# Patient Record
Sex: Female | Born: 1935 | Race: White | Hispanic: No | Marital: Married
Health system: Southern US, Community
[De-identification: ages and names within clinical notes are randomized; demographics above are authoritative.]

---

## 2003-09-19 ENCOUNTER — Encounter: Payer: Self-pay | Admitting: Neurosurgery

## 2003-09-19 ENCOUNTER — Ambulatory Visit (HOSPITAL_COMMUNITY): Admission: RE | Admit: 2003-09-19 | Discharge: 2003-09-19 | Payer: Self-pay | Admitting: Neurosurgery

## 2004-09-30 ENCOUNTER — Ambulatory Visit: Payer: Self-pay | Admitting: Pain Medicine

## 2004-11-11 ENCOUNTER — Ambulatory Visit: Payer: Self-pay | Admitting: Physician Assistant

## 2004-12-02 ENCOUNTER — Ambulatory Visit: Payer: Self-pay | Admitting: Internal Medicine

## 2005-01-20 ENCOUNTER — Ambulatory Visit: Payer: Self-pay | Admitting: Pain Medicine

## 2005-04-17 ENCOUNTER — Ambulatory Visit: Payer: Self-pay | Admitting: Physician Assistant

## 2005-04-29 ENCOUNTER — Other Ambulatory Visit: Payer: Self-pay

## 2005-05-06 ENCOUNTER — Inpatient Hospital Stay: Payer: Self-pay | Admitting: Unknown Physician Specialty

## 2005-06-04 ENCOUNTER — Encounter: Payer: Self-pay | Admitting: Unknown Physician Specialty

## 2005-06-28 ENCOUNTER — Encounter: Payer: Self-pay | Admitting: Unknown Physician Specialty

## 2005-08-12 ENCOUNTER — Ambulatory Visit: Payer: Self-pay | Admitting: Physician Assistant

## 2005-09-29 ENCOUNTER — Ambulatory Visit: Payer: Self-pay | Admitting: Unknown Physician Specialty

## 2005-12-01 ENCOUNTER — Ambulatory Visit: Payer: Self-pay | Admitting: Physician Assistant

## 2005-12-15 ENCOUNTER — Ambulatory Visit: Payer: Self-pay | Admitting: Physician Assistant

## 2005-12-16 ENCOUNTER — Ambulatory Visit: Payer: Self-pay | Admitting: Pain Medicine

## 2006-01-06 ENCOUNTER — Ambulatory Visit: Payer: Self-pay | Admitting: Physician Assistant

## 2006-01-20 ENCOUNTER — Ambulatory Visit: Payer: Self-pay | Admitting: Internal Medicine

## 2006-02-12 ENCOUNTER — Ambulatory Visit (HOSPITAL_COMMUNITY): Admission: RE | Admit: 2006-02-12 | Discharge: 2006-02-12 | Payer: Self-pay | Admitting: Neurosurgery

## 2006-02-13 ENCOUNTER — Inpatient Hospital Stay (HOSPITAL_COMMUNITY): Admission: RE | Admit: 2006-02-13 | Discharge: 2006-02-14 | Payer: Self-pay | Admitting: Neurosurgery

## 2006-03-30 ENCOUNTER — Ambulatory Visit: Payer: Self-pay | Admitting: Physician Assistant

## 2006-04-10 ENCOUNTER — Encounter: Admission: RE | Admit: 2006-04-10 | Discharge: 2006-04-10 | Payer: Self-pay | Admitting: Neurosurgery

## 2006-07-02 ENCOUNTER — Ambulatory Visit: Payer: Self-pay | Admitting: Physician Assistant

## 2006-07-08 ENCOUNTER — Ambulatory Visit: Payer: Self-pay | Admitting: Unknown Physician Specialty

## 2006-07-31 ENCOUNTER — Ambulatory Visit: Payer: Self-pay | Admitting: Physician Assistant

## 2006-08-12 ENCOUNTER — Ambulatory Visit: Payer: Self-pay | Admitting: Neurosurgery

## 2006-10-15 ENCOUNTER — Encounter: Admission: RE | Admit: 2006-10-15 | Discharge: 2006-10-15 | Payer: Self-pay | Admitting: Neurosurgery

## 2006-10-27 ENCOUNTER — Ambulatory Visit: Payer: Self-pay | Admitting: Physician Assistant

## 2006-11-10 ENCOUNTER — Ambulatory Visit: Payer: Self-pay | Admitting: Ophthalmology

## 2006-11-16 ENCOUNTER — Ambulatory Visit: Payer: Self-pay | Admitting: Ophthalmology

## 2006-11-26 ENCOUNTER — Ambulatory Visit: Payer: Self-pay | Admitting: Physician Assistant

## 2007-02-04 ENCOUNTER — Ambulatory Visit: Payer: Self-pay | Admitting: Internal Medicine

## 2007-02-16 ENCOUNTER — Other Ambulatory Visit: Payer: Self-pay

## 2007-02-19 ENCOUNTER — Ambulatory Visit: Payer: Self-pay | Admitting: Physician Assistant

## 2007-02-22 ENCOUNTER — Inpatient Hospital Stay: Payer: Self-pay | Admitting: Unknown Physician Specialty

## 2007-03-22 ENCOUNTER — Ambulatory Visit: Payer: Self-pay | Admitting: Physician Assistant

## 2007-04-19 ENCOUNTER — Ambulatory Visit: Payer: Self-pay | Admitting: Physician Assistant

## 2007-04-26 ENCOUNTER — Ambulatory Visit: Payer: Self-pay | Admitting: Physician Assistant

## 2007-05-10 ENCOUNTER — Ambulatory Visit: Payer: Self-pay | Admitting: Physician Assistant

## 2007-05-12 ENCOUNTER — Ambulatory Visit: Payer: Self-pay | Admitting: Pain Medicine

## 2007-05-13 ENCOUNTER — Ambulatory Visit: Payer: Self-pay | Admitting: Pain Medicine

## 2007-05-26 ENCOUNTER — Ambulatory Visit: Payer: Self-pay | Admitting: Pain Medicine

## 2007-06-18 ENCOUNTER — Ambulatory Visit: Payer: Self-pay | Admitting: Physician Assistant

## 2007-06-29 ENCOUNTER — Ambulatory Visit: Payer: Self-pay | Admitting: Pain Medicine

## 2007-07-13 ENCOUNTER — Ambulatory Visit: Payer: Self-pay | Admitting: Physician Assistant

## 2007-08-02 ENCOUNTER — Ambulatory Visit: Payer: Self-pay | Admitting: Pain Medicine

## 2007-08-03 ENCOUNTER — Ambulatory Visit: Payer: Self-pay | Admitting: Pain Medicine

## 2007-08-10 ENCOUNTER — Ambulatory Visit: Payer: Self-pay | Admitting: Unknown Physician Specialty

## 2007-08-17 ENCOUNTER — Ambulatory Visit: Payer: Self-pay | Admitting: Pain Medicine

## 2007-09-01 ENCOUNTER — Ambulatory Visit: Payer: Self-pay | Admitting: Pain Medicine

## 2007-11-01 ENCOUNTER — Inpatient Hospital Stay: Payer: Self-pay | Admitting: Pain Medicine

## 2007-11-01 ENCOUNTER — Ambulatory Visit: Payer: Self-pay | Admitting: Pain Medicine

## 2007-11-23 ENCOUNTER — Ambulatory Visit: Payer: Self-pay | Admitting: Pain Medicine

## 2007-11-30 ENCOUNTER — Ambulatory Visit: Payer: Self-pay | Admitting: Pain Medicine

## 2007-12-09 ENCOUNTER — Ambulatory Visit: Payer: Self-pay | Admitting: Physician Assistant

## 2007-12-15 ENCOUNTER — Ambulatory Visit: Payer: Self-pay | Admitting: Physician Assistant

## 2008-01-20 ENCOUNTER — Ambulatory Visit: Payer: Self-pay | Admitting: Physician Assistant

## 2008-01-27 ENCOUNTER — Ambulatory Visit: Payer: Self-pay | Admitting: Physician Assistant

## 2008-02-07 ENCOUNTER — Ambulatory Visit: Payer: Self-pay | Admitting: Internal Medicine

## 2008-02-08 ENCOUNTER — Ambulatory Visit: Payer: Self-pay | Admitting: Pain Medicine

## 2008-02-08 ENCOUNTER — Ambulatory Visit: Payer: Self-pay | Admitting: Physician Assistant

## 2008-02-29 ENCOUNTER — Ambulatory Visit: Payer: Self-pay | Admitting: Physician Assistant

## 2008-03-08 ENCOUNTER — Ambulatory Visit: Payer: Self-pay | Admitting: Physician Assistant

## 2008-04-11 ENCOUNTER — Ambulatory Visit: Payer: Self-pay | Admitting: Physician Assistant

## 2008-04-25 ENCOUNTER — Ambulatory Visit: Payer: Self-pay | Admitting: Pain Medicine

## 2008-05-09 ENCOUNTER — Ambulatory Visit: Payer: Self-pay | Admitting: Physician Assistant

## 2008-06-28 ENCOUNTER — Ambulatory Visit: Payer: Self-pay | Admitting: Physician Assistant

## 2008-08-08 ENCOUNTER — Ambulatory Visit: Payer: Self-pay | Admitting: Physician Assistant

## 2008-08-22 ENCOUNTER — Ambulatory Visit: Payer: Self-pay | Admitting: Pain Medicine

## 2008-09-05 ENCOUNTER — Ambulatory Visit: Payer: Self-pay | Admitting: Physician Assistant

## 2008-09-27 ENCOUNTER — Ambulatory Visit: Payer: Self-pay | Admitting: Physician Assistant

## 2008-10-24 ENCOUNTER — Ambulatory Visit: Payer: Self-pay | Admitting: Physician Assistant

## 2009-02-08 ENCOUNTER — Ambulatory Visit: Payer: Self-pay | Admitting: Internal Medicine

## 2009-02-20 ENCOUNTER — Ambulatory Visit: Payer: Self-pay | Admitting: Physician Assistant

## 2009-03-07 ENCOUNTER — Ambulatory Visit: Payer: Self-pay | Admitting: Physician Assistant

## 2009-05-16 ENCOUNTER — Ambulatory Visit: Payer: Self-pay | Admitting: Physician Assistant

## 2009-08-08 ENCOUNTER — Ambulatory Visit: Payer: Self-pay | Admitting: Physician Assistant

## 2009-08-21 ENCOUNTER — Ambulatory Visit: Payer: Self-pay | Admitting: Pain Medicine

## 2009-09-05 ENCOUNTER — Ambulatory Visit: Payer: Self-pay | Admitting: Physician Assistant

## 2009-12-04 ENCOUNTER — Ambulatory Visit: Payer: Self-pay | Admitting: Physician Assistant

## 2010-02-11 ENCOUNTER — Ambulatory Visit: Payer: Self-pay | Admitting: Internal Medicine

## 2010-02-28 ENCOUNTER — Ambulatory Visit: Payer: Self-pay | Admitting: Pain Medicine

## 2010-04-02 ENCOUNTER — Ambulatory Visit: Payer: Self-pay | Admitting: Pain Medicine

## 2010-07-24 ENCOUNTER — Ambulatory Visit: Payer: Self-pay | Admitting: Neurosurgery

## 2010-07-29 ENCOUNTER — Ambulatory Visit: Payer: Self-pay | Admitting: Pain Medicine

## 2010-11-16 ENCOUNTER — Inpatient Hospital Stay: Payer: Self-pay | Admitting: Internal Medicine

## 2011-01-19 ENCOUNTER — Encounter: Payer: Self-pay | Admitting: Neurosurgery

## 2011-08-18 ENCOUNTER — Inpatient Hospital Stay: Payer: Self-pay | Admitting: Surgery

## 2012-02-09 ENCOUNTER — Ambulatory Visit: Payer: Self-pay | Admitting: Gastroenterology

## 2012-02-13 ENCOUNTER — Emergency Department: Payer: Self-pay | Admitting: Emergency Medicine

## 2012-02-14 LAB — COMPREHENSIVE METABOLIC PANEL
Alkaline Phosphatase: 79 U/L (ref 50–136)
Bilirubin,Total: 0.4 mg/dL (ref 0.2–1.0)
Calcium, Total: 9.2 mg/dL (ref 8.5–10.1)
Co2: 31 mmol/L (ref 21–32)
Creatinine: 0.71 mg/dL (ref 0.60–1.30)
Osmolality: 279 (ref 275–301)
SGOT(AST): 18 U/L (ref 15–37)
SGPT (ALT): 10 U/L — ABNORMAL LOW

## 2012-02-14 LAB — CBC
HCT: 32.3 % — ABNORMAL LOW (ref 35.0–47.0)
HGB: 10.3 g/dL — ABNORMAL LOW (ref 12.0–16.0)
MCHC: 32 g/dL (ref 32.0–36.0)

## 2012-02-14 LAB — TROPONIN I: Troponin-I: <0.02 ng/mL

## 2012-02-14 LAB — URINALYSIS, COMPLETE
Bacteria: NONE SEEN
Leukocyte Esterase: NEGATIVE
Ph: 6 (ref 4.5–8.0)
RBC,UR: 3 /HPF (ref 0–5)
Squamous Epithelial: NONE SEEN
WBC UR: 1 /HPF (ref 0–5)

## 2012-08-22 ENCOUNTER — Emergency Department: Payer: Self-pay | Admitting: Emergency Medicine

## 2013-02-28 ENCOUNTER — Inpatient Hospital Stay: Payer: Self-pay | Admitting: Internal Medicine

## 2013-02-28 LAB — URINALYSIS, COMPLETE
Bilirubin,UR: NEGATIVE
Glucose,UR: NEGATIVE mg/dL (ref 0–75)
Nitrite: NEGATIVE
RBC,UR: 10 /HPF (ref 0–5)

## 2013-02-28 LAB — COMPREHENSIVE METABOLIC PANEL
Anion Gap: 6 — ABNORMAL LOW (ref 7–16)
BUN: 22 mg/dL — ABNORMAL HIGH (ref 7–18)
Glucose: 85 mg/dL (ref 65–99)
Potassium: 3.9 mmol/L (ref 3.5–5.1)
SGOT(AST): 24 U/L (ref 15–37)
SGPT (ALT): 9 U/L — ABNORMAL LOW (ref 12–78)
Sodium: 137 mmol/L (ref 136–145)
Total Protein: 6.7 g/dL (ref 6.4–8.2)

## 2013-02-28 LAB — CBC
HGB: 10.4 g/dL — ABNORMAL LOW (ref 12.0–16.0)
MCHC: 31.6 g/dL — ABNORMAL LOW (ref 32.0–36.0)

## 2013-03-01 LAB — CBC WITH DIFFERENTIAL/PLATELET
Basophil %: 0.2 %
Eosinophil #: 0 10*3/uL (ref 0.0–0.7)
Eosinophil %: 0.3 %
HGB: 9.7 g/dL — ABNORMAL LOW (ref 12.0–16.0)
Lymphocyte #: 0.5 10*3/uL — ABNORMAL LOW (ref 1.0–3.6)
Lymphocyte %: 4.7 %
MCH: 27.2 pg (ref 26.0–34.0)
Monocyte #: 0.8 x10 3/mm (ref 0.2–0.9)
Monocyte %: 7.4 %
RBC: 3.58 10*6/uL — ABNORMAL LOW (ref 3.80–5.20)
RDW: 17.1 % — ABNORMAL HIGH (ref 11.5–14.5)

## 2013-03-01 LAB — BASIC METABOLIC PANEL
BUN: 18 mg/dL (ref 7–18)
Calcium, Total: 8.5 mg/dL (ref 8.5–10.1)
EGFR (Non-African Amer.): >60
Potassium: 3.8 mmol/L (ref 3.5–5.1)
Sodium: 140 mmol/L (ref 136–145)

## 2013-03-02 LAB — CBC WITH DIFFERENTIAL/PLATELET
Eosinophil %: 0.5 %
HGB: 10.2 g/dL — ABNORMAL LOW (ref 12.0–16.0)
Lymphocyte #: 0.7 10*3/uL — ABNORMAL LOW (ref 1.0–3.6)
MCH: 27.1 pg (ref 26.0–34.0)
MCHC: 32.1 g/dL (ref 32.0–36.0)
MCV: 84 fL (ref 80–100)
Neutrophil #: 10.5 10*3/uL — ABNORMAL HIGH (ref 1.4–6.5)
Platelet: 233 10*3/uL (ref 150–440)

## 2013-03-03 LAB — EXPECTORATED SPUTUM ASSESSMENT W GRAM STAIN, RFLX TO RESP C

## 2013-03-04 DIAGNOSIS — I059 Rheumatic mitral valve disease, unspecified: Secondary | ICD-10-CM

## 2013-03-04 LAB — VANCOMYCIN, TROUGH: Vancomycin, Trough: 3 ug/mL — ABNORMAL LOW (ref 10–20)

## 2013-03-05 LAB — CBC WITH DIFFERENTIAL/PLATELET
Basophil #: 0.1 10*3/uL (ref 0.0–0.1)
Eosinophil #: 0.2 10*3/uL (ref 0.0–0.7)
Lymphocyte #: 2.1 10*3/uL (ref 1.0–3.6)
MCV: 83 fL (ref 80–100)
Monocyte %: 13 %
Neutrophil #: 12.5 10*3/uL — ABNORMAL HIGH (ref 1.4–6.5)
Neutrophil %: 73.1 %
WBC: 17.1 10*3/uL — ABNORMAL HIGH (ref 3.6–11.0)

## 2013-03-06 LAB — CULTURE, BLOOD (SINGLE)

## 2013-03-06 LAB — CBC WITH DIFFERENTIAL/PLATELET
Eosinophil %: 2.2 %
Lymphocyte %: 14 %
MCH: 27.5 pg (ref 26.0–34.0)
MCV: 83 fL (ref 80–100)
Monocyte #: 2.1 x10 3/mm — ABNORMAL HIGH (ref 0.2–0.9)
Monocyte %: 12.8 %
Neutrophil %: 70.4 %
RBC: 4.05 10*6/uL (ref 3.80–5.20)
RDW: 17.6 % — ABNORMAL HIGH (ref 11.5–14.5)
WBC: 16.5 10*3/uL — ABNORMAL HIGH (ref 3.6–11.0)

## 2013-03-06 LAB — CREATININE, SERUM
Creatinine: 0.51 mg/dL — ABNORMAL LOW (ref 0.60–1.30)
EGFR (African American): >60
EGFR (Non-African Amer.): >60

## 2013-03-06 LAB — VANCOMYCIN, TROUGH: Vancomycin, Trough: 9 ug/mL — ABNORMAL LOW (ref 10–20)

## 2013-03-08 LAB — CBC WITH DIFFERENTIAL/PLATELET
Basophil %: 0.8 %
Eosinophil #: 0.4 10*3/uL (ref 0.0–0.7)
Eosinophil %: 1.8 %
HCT: 33.9 % — ABNORMAL LOW (ref 35.0–47.0)
Lymphocyte %: 11.7 %
MCH: 26.9 pg (ref 26.0–34.0)
MCHC: 32.4 g/dL (ref 32.0–36.0)
MCV: 83 fL (ref 80–100)
Monocyte #: 1.5 x10 3/mm — ABNORMAL HIGH (ref 0.2–0.9)
Neutrophil #: 19.7 10*3/uL — ABNORMAL HIGH (ref 1.4–6.5)
Neutrophil %: 79.8 %
Platelet: 561 10*3/uL — ABNORMAL HIGH (ref 150–440)
RBC: 4.09 10*6/uL (ref 3.80–5.20)
RDW: 17.7 % — ABNORMAL HIGH (ref 11.5–14.5)
WBC: 24.7 10*3/uL — ABNORMAL HIGH (ref 3.6–11.0)

## 2013-03-08 LAB — VANCOMYCIN, TROUGH: Vancomycin, Trough: 18 ug/mL (ref 10–20)

## 2013-03-08 LAB — CREATININE, SERUM
Creatinine: 0.62 mg/dL (ref 0.60–1.30)
EGFR (African American): >60
EGFR (Non-African Amer.): >60

## 2013-03-08 LAB — WBC: WBC: 24.8 10*3/uL — ABNORMAL HIGH (ref 3.6–11.0)

## 2013-03-08 LAB — CULTURE, BLOOD (SINGLE)

## 2013-05-24 ENCOUNTER — Other Ambulatory Visit: Payer: Self-pay | Admitting: Family

## 2013-05-24 DIAGNOSIS — R1310 Dysphagia, unspecified: Secondary | ICD-10-CM

## 2013-06-08 ENCOUNTER — Other Ambulatory Visit: Payer: Self-pay | Admitting: Family

## 2013-06-08 ENCOUNTER — Ambulatory Visit
Admission: RE | Admit: 2013-06-08 | Discharge: 2013-06-08 | Disposition: A | Discharge status: Home / Self Care | Payer: Medicare Other | Source: Ambulatory Visit | Attending: Family | Admitting: Family

## 2013-06-08 DIAGNOSIS — R1310 Dysphagia, unspecified: Secondary | ICD-10-CM

## 2013-07-07 ENCOUNTER — Observation Stay: Payer: Self-pay | Admitting: Internal Medicine

## 2013-07-07 LAB — COMPREHENSIVE METABOLIC PANEL
Alkaline Phosphatase: 113 U/L (ref 50–136)
Anion Gap: 3 — ABNORMAL LOW (ref 7–16)
BUN: 23 mg/dL — ABNORMAL HIGH (ref 7–18)
Bilirubin,Total: 0.2 mg/dL (ref 0.2–1.0)
Calcium, Total: 9.1 mg/dL (ref 8.5–10.1)
Co2: 31 mmol/L (ref 21–32)
EGFR (African American): >60
Osmolality: 273 (ref 275–301)
Potassium: 4.1 mmol/L (ref 3.5–5.1)
SGOT(AST): 25 U/L (ref 15–37)
SGPT (ALT): 11 U/L — ABNORMAL LOW (ref 12–78)
Sodium: 136 mmol/L (ref 136–145)
Total Protein: 8.2 g/dL (ref 6.4–8.2)

## 2013-07-07 LAB — CBC WITH DIFFERENTIAL/PLATELET
Basophil #: 0.1 10*3/uL (ref 0.0–0.1)
Eosinophil #: 0.3 10*3/uL (ref 0.0–0.7)
Eosinophil %: 2.3 %
HCT: 32.8 % — ABNORMAL LOW (ref 35.0–47.0)
Lymphocyte #: 3.6 10*3/uL (ref 1.0–3.6)
Lymphocyte %: 28.1 %
MCH: 23.7 pg — ABNORMAL LOW (ref 26.0–34.0)
Monocyte #: 1.9 x10 3/mm — ABNORMAL HIGH (ref 0.2–0.9)
Monocyte %: 14.9 %
Neutrophil #: 6.9 10*3/uL — ABNORMAL HIGH (ref 1.4–6.5)
WBC: 12.8 10*3/uL — ABNORMAL HIGH (ref 3.6–11.0)

## 2013-07-07 LAB — URINALYSIS, COMPLETE
Bilirubin,UR: NEGATIVE
Ketone: NEGATIVE
Ph: 5 (ref 4.5–8.0)
Specific Gravity: 1.018 (ref 1.003–1.030)
Squamous Epithelial: <1

## 2013-07-08 LAB — BASIC METABOLIC PANEL
Calcium, Total: 8.8 mg/dL (ref 8.5–10.1)
Chloride: 99 mmol/L (ref 98–107)
Co2: 30 mmol/L (ref 21–32)
EGFR (Non-African Amer.): >60
Glucose: 97 mg/dL (ref 65–99)
Osmolality: 273 (ref 275–301)
Sodium: 136 mmol/L (ref 136–145)

## 2013-07-08 LAB — CBC WITH DIFFERENTIAL/PLATELET
Basophil #: 0 10*3/uL (ref 0.0–0.1)
Basophil %: 0.5 %
Eosinophil #: 0.2 10*3/uL (ref 0.0–0.7)
HGB: 10 g/dL — ABNORMAL LOW (ref 12.0–16.0)
Lymphocyte #: 2.1 10*3/uL (ref 1.0–3.6)
MCV: 74 fL — ABNORMAL LOW (ref 80–100)
Monocyte %: 10.1 %
Neutrophil #: 7.3 10*3/uL — ABNORMAL HIGH (ref 1.4–6.5)
Platelet: 361 10*3/uL (ref 150–440)
WBC: 10.8 10*3/uL (ref 3.6–11.0)

## 2013-07-09 LAB — URINE CULTURE

## 2013-08-19 IMAGING — CT CT HEAD WITHOUT CONTRAST
1 of 2 series · 13 of 30 positions shown, 17 images · non-contrast
Comparison: none

REASON FOR EXAM: AMS
COMMENTS:

PROCEDURE:     CT  - CT HEAD WITHOUT CONTRAST  - July 07, 2013  [DATE]
RESULT:     Comparison:  08/22/2012
TECHNIQUE: Multiple axial images from the foramen magnum to the vertex were
obtained without IV contrast.

[Series 4: soft tissue 2 · axial · 0.40mm/px · z∈[-20,+114]mm · 13 of 33 slices shown, 17 images]
[im 3/33  brain]
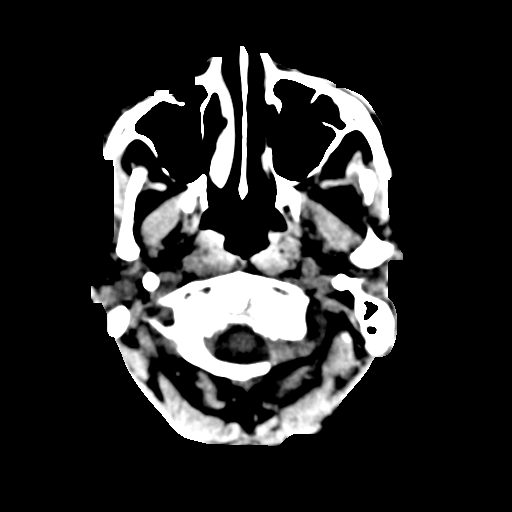
[im 3/33  bone]
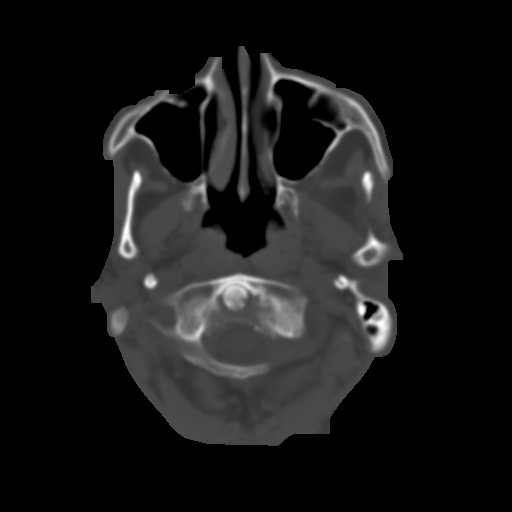
[im 5/33  brain]
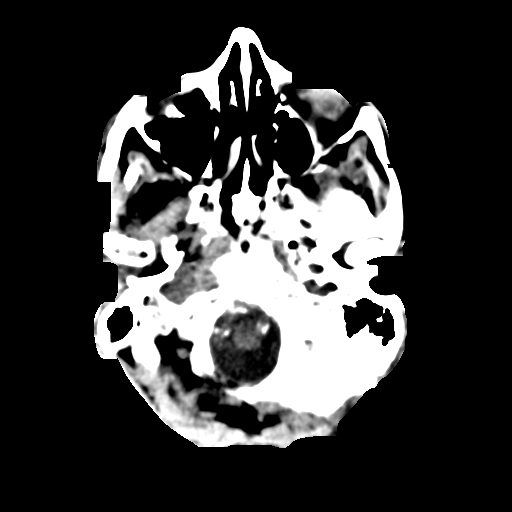
[im 7/33  brain]
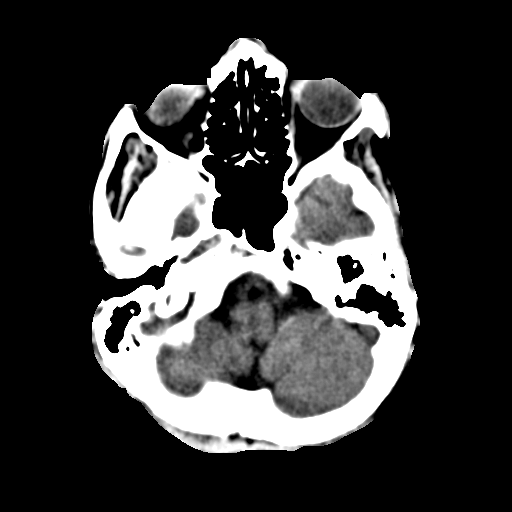
[im 10/33  brain]
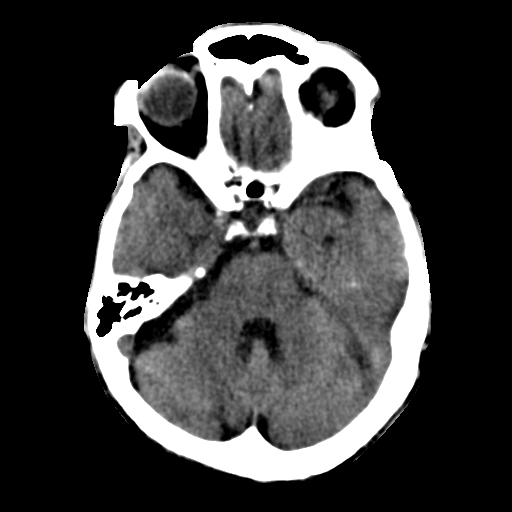
[im 12/33  brain]
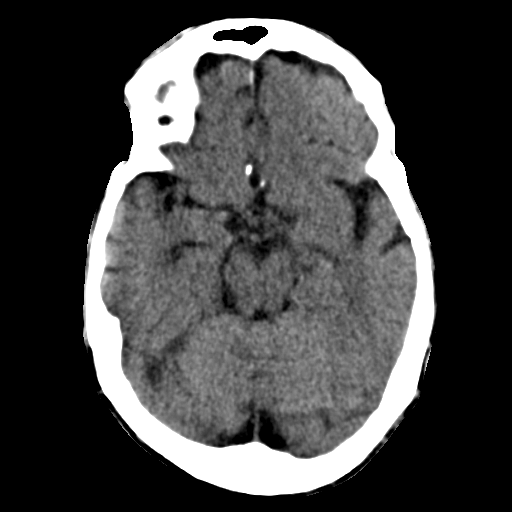
[im 12/33  bone]
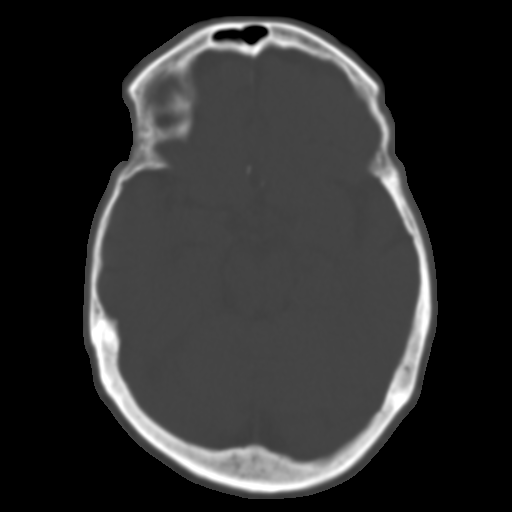
[im 14/33  brain]
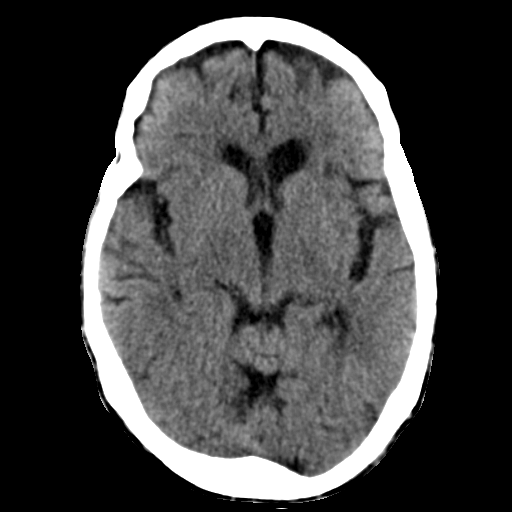
[im 17/33  brain]
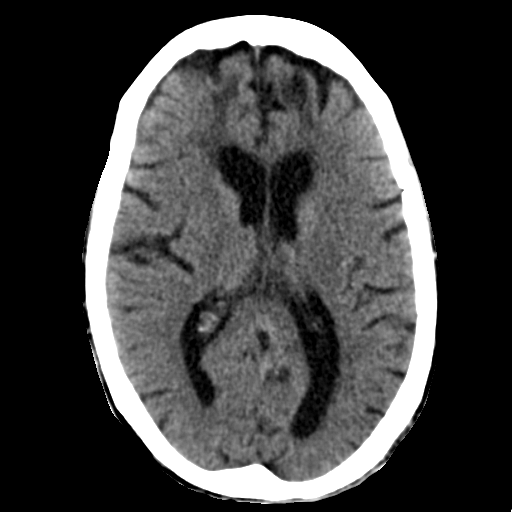
[im 19/33  brain]
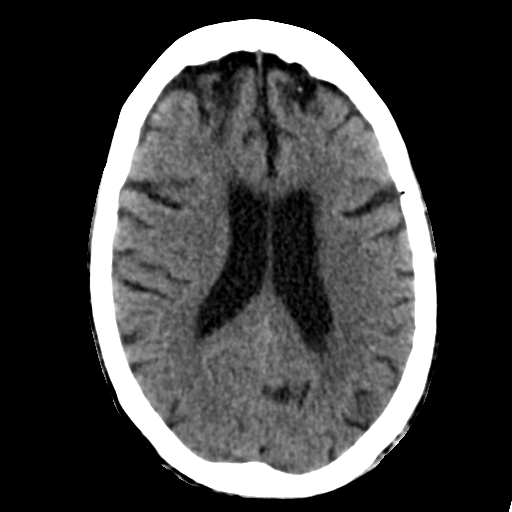
[im 21/33  brain]
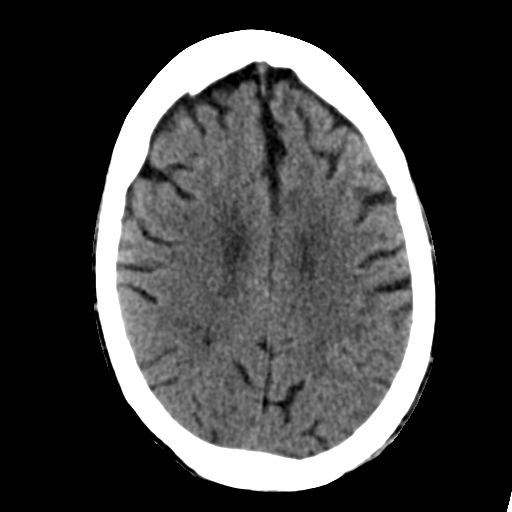
[im 21/33  bone]
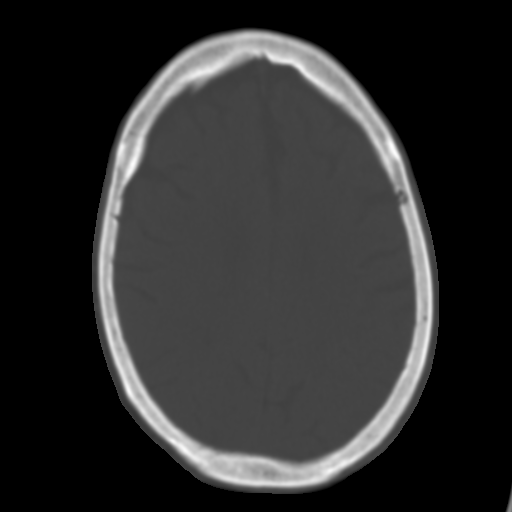
[im 23/33  brain]
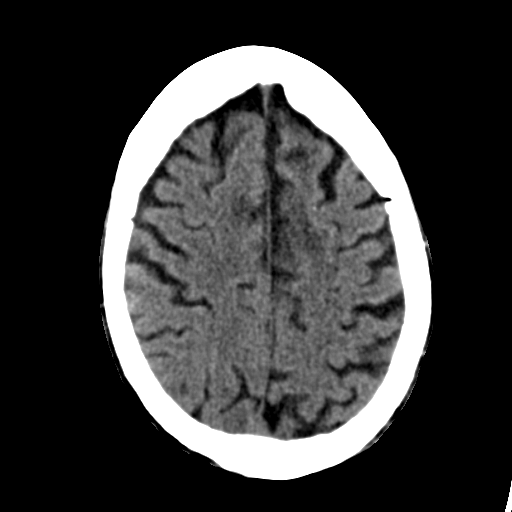
[im 26/33  brain]
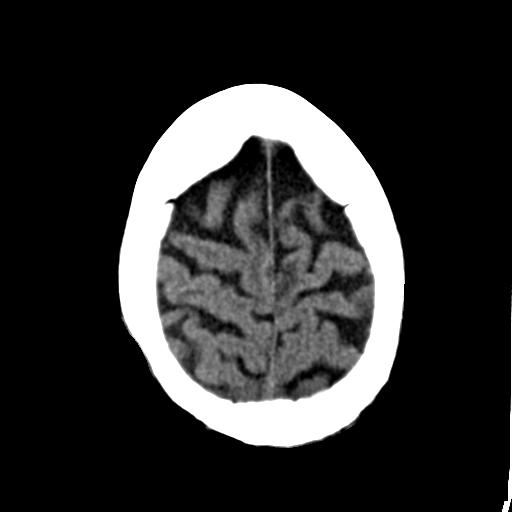
[im 28/33  brain]
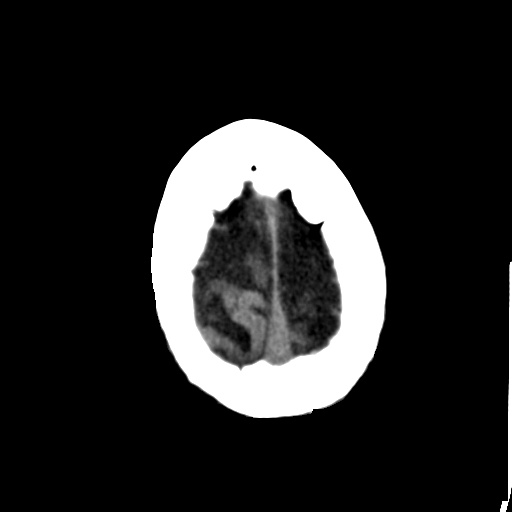
[im 30/33  brain]
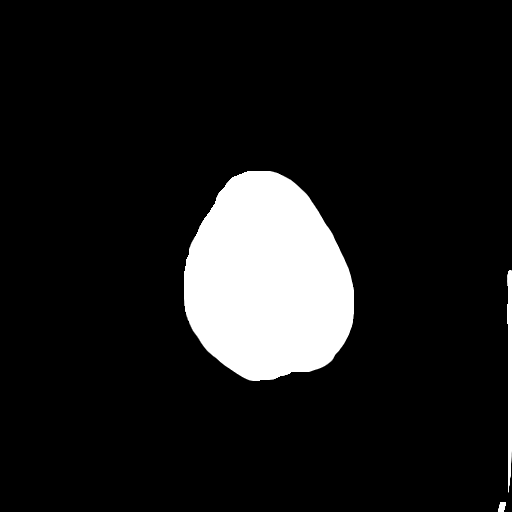
[im 30/33  bone]
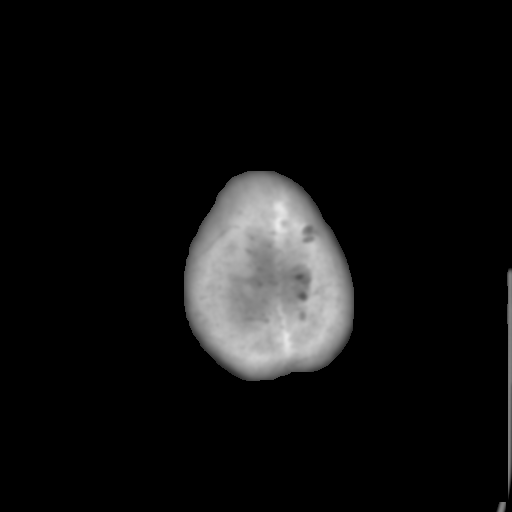

[13 of 30 positions shown; findings below may reference images not displayed]

FINDINGS: There is no evidence for mass effect, midline shift, or extra-axial fluid
collections. There is no evidence for space-occupying lesion, intracranial
hemorrhage, or cortical-based area of infarction. Periventricular and
subcortical hypoattenuation is consistent with chronic small vessel ischemic
disease. Punctate calcification in a right frontoparietal sulcus is similar
to prior and nonspecific.

The osseous structures are unremarkable.
IMPRESSION: 1. No acute intracranial process.
2. Chronic small vessel ischemic disease.

[REDACTED]

## 2014-01-11 ENCOUNTER — Inpatient Hospital Stay: Payer: Self-pay | Admitting: Internal Medicine

## 2014-01-11 LAB — MAGNESIUM: Magnesium: 2.2 mg/dL

## 2014-01-11 LAB — URINALYSIS, COMPLETE
Bilirubin,UR: NEGATIVE
Glucose,UR: NEGATIVE mg/dL (ref 0–75)
KETONE: NEGATIVE
LEUKOCYTE ESTERASE: NEGATIVE
Nitrite: NEGATIVE
PROTEIN: NEGATIVE
Ph: 7 (ref 4.5–8.0)
Specific Gravity: 1.015 (ref 1.003–1.030)
Squamous Epithelial: NONE SEEN
WBC UR: 13 /HPF (ref 0–5)

## 2014-01-11 LAB — CBC WITH DIFFERENTIAL/PLATELET
Basophil #: 0.1 10*3/uL (ref 0.0–0.1)
Basophil %: 0.2 %
Eosinophil #: 0 10*3/uL (ref 0.0–0.7)
Eosinophil %: 0 %
HCT: 40.7 % (ref 35.0–47.0)
HGB: 12.9 g/dL (ref 12.0–16.0)
LYMPHS PCT: 1.7 %
Lymphocyte #: 0.5 10*3/uL — ABNORMAL LOW (ref 1.0–3.6)
MCH: 23.2 pg — ABNORMAL LOW (ref 26.0–34.0)
MCHC: 31.6 g/dL — AB (ref 32.0–36.0)
MCV: 73 fL — ABNORMAL LOW (ref 80–100)
MONO ABS: 1.2 x10 3/mm — AB (ref 0.2–0.9)
MONOS PCT: 3.9 %
Neutrophil #: 29.4 10*3/uL — ABNORMAL HIGH (ref 1.4–6.5)
Neutrophil %: 94.2 %
Platelet: 542 10*3/uL — ABNORMAL HIGH (ref 150–440)
RBC: 5.55 10*6/uL — ABNORMAL HIGH (ref 3.80–5.20)
RDW: 19.6 % — ABNORMAL HIGH (ref 11.5–14.5)
WBC: 31.2 10*3/uL — ABNORMAL HIGH (ref 3.6–11.0)

## 2014-01-11 LAB — COMPREHENSIVE METABOLIC PANEL
ALBUMIN: 2.8 g/dL — AB (ref 3.4–5.0)
ALT: 12 U/L (ref 12–78)
ANION GAP: 4 — AB (ref 7–16)
AST: 34 U/L (ref 15–37)
Alkaline Phosphatase: 103 U/L
BUN: 13 mg/dL (ref 7–18)
Bilirubin,Total: 0.3 mg/dL (ref 0.2–1.0)
CHLORIDE: 100 mmol/L (ref 98–107)
CO2: 29 mmol/L (ref 21–32)
CREATININE: 0.58 mg/dL — AB (ref 0.60–1.30)
Calcium, Total: 9.6 mg/dL (ref 8.5–10.1)
EGFR (African American): >60
EGFR (Non-African Amer.): >60
Glucose: 140 mg/dL — ABNORMAL HIGH (ref 65–99)
Osmolality: 269 (ref 275–301)
Potassium: 4.7 mmol/L (ref 3.5–5.1)
SODIUM: 133 mmol/L — AB (ref 136–145)
Total Protein: 9.3 g/dL — ABNORMAL HIGH (ref 6.4–8.2)

## 2014-01-11 LAB — PROTIME-INR
INR: 1.1
Prothrombin Time: 13.9 secs (ref 11.5–14.7)

## 2014-01-11 LAB — TROPONIN I: Troponin-I: <0.02 ng/mL

## 2014-01-11 LAB — PHOSPHORUS: Phosphorus: 4 mg/dL (ref 2.5–4.9)

## 2014-01-12 LAB — CBC WITH DIFFERENTIAL/PLATELET
BASOS PCT: 0.2 %
Basophil #: 0 10*3/uL (ref 0.0–0.1)
Eosinophil #: 0 10*3/uL (ref 0.0–0.7)
Eosinophil %: 0 %
HCT: 30.7 % — AB (ref 35.0–47.0)
HGB: 9.8 g/dL — AB (ref 12.0–16.0)
Lymphocyte #: 1.2 10*3/uL (ref 1.0–3.6)
Lymphocyte %: 4.8 %
MCH: 23 pg — ABNORMAL LOW (ref 26.0–34.0)
MCHC: 31.8 g/dL — AB (ref 32.0–36.0)
MCV: 72 fL — ABNORMAL LOW (ref 80–100)
MONO ABS: 1.3 x10 3/mm — AB (ref 0.2–0.9)
Monocyte %: 5.1 %
NEUTROS ABS: 22.8 10*3/uL — AB (ref 1.4–6.5)
Neutrophil %: 89.9 %
PLATELETS: 402 10*3/uL (ref 150–440)
RBC: 4.24 10*6/uL (ref 3.80–5.20)
RDW: 19.2 % — ABNORMAL HIGH (ref 11.5–14.5)
WBC: 25.4 10*3/uL — AB (ref 3.6–11.0)

## 2014-01-12 LAB — BASIC METABOLIC PANEL
Anion Gap: 4 — ABNORMAL LOW (ref 7–16)
BUN: 18 mg/dL (ref 7–18)
Calcium, Total: 8.9 mg/dL (ref 8.5–10.1)
Chloride: 101 mmol/L (ref 98–107)
Co2: 29 mmol/L (ref 21–32)
Creatinine: 0.88 mg/dL (ref 0.60–1.30)
EGFR (Non-African Amer.): >60
GLUCOSE: 106 mg/dL — AB (ref 65–99)
Osmolality: 271 (ref 275–301)
POTASSIUM: 3.6 mmol/L (ref 3.5–5.1)
Sodium: 134 mmol/L — ABNORMAL LOW (ref 136–145)

## 2014-01-13 LAB — CBC WITH DIFFERENTIAL/PLATELET
Basophil #: 0 10*3/uL (ref 0.0–0.1)
Basophil %: 0.2 %
EOS PCT: 0.3 %
Eosinophil #: 0.1 10*3/uL (ref 0.0–0.7)
HCT: 29 % — ABNORMAL LOW (ref 35.0–47.0)
HGB: 9.2 g/dL — AB (ref 12.0–16.0)
LYMPHS ABS: 1.5 10*3/uL (ref 1.0–3.6)
LYMPHS PCT: 8.3 %
MCH: 23.2 pg — AB (ref 26.0–34.0)
MCHC: 31.7 g/dL — ABNORMAL LOW (ref 32.0–36.0)
MCV: 73 fL — ABNORMAL LOW (ref 80–100)
Monocyte #: 1.3 x10 3/mm — ABNORMAL HIGH (ref 0.2–0.9)
Monocyte %: 7.4 %
NEUTROS PCT: 83.8 %
Neutrophil #: 14.8 10*3/uL — ABNORMAL HIGH (ref 1.4–6.5)
PLATELETS: 379 10*3/uL (ref 150–440)
RBC: 3.97 10*6/uL (ref 3.80–5.20)
RDW: 19 % — AB (ref 11.5–14.5)
WBC: 17.7 10*3/uL — ABNORMAL HIGH (ref 3.6–11.0)

## 2014-01-14 LAB — CBC WITH DIFFERENTIAL/PLATELET
Basophil #: 0.1 10*3/uL (ref 0.0–0.1)
Basophil %: 0.5 %
Eosinophil #: 0.1 10*3/uL (ref 0.0–0.7)
Eosinophil %: 0.7 %
HCT: 32.5 % — ABNORMAL LOW (ref 35.0–47.0)
HGB: 10.4 g/dL — ABNORMAL LOW (ref 12.0–16.0)
Lymphocyte #: 0.7 10*3/uL — ABNORMAL LOW (ref 1.0–3.6)
Lymphocyte %: 6.1 %
MCH: 23.3 pg — AB (ref 26.0–34.0)
MCHC: 31.9 g/dL — ABNORMAL LOW (ref 32.0–36.0)
MCV: 73 fL — ABNORMAL LOW (ref 80–100)
MONOS PCT: 6.6 %
Monocyte #: 0.8 x10 3/mm (ref 0.2–0.9)
Neutrophil #: 10.4 10*3/uL — ABNORMAL HIGH (ref 1.4–6.5)
Neutrophil %: 86.1 %
Platelet: 412 10*3/uL (ref 150–440)
RBC: 4.44 10*6/uL (ref 3.80–5.20)
RDW: 19 % — AB (ref 11.5–14.5)
WBC: 12.1 10*3/uL — ABNORMAL HIGH (ref 3.6–11.0)

## 2014-01-14 LAB — URINE CULTURE

## 2014-01-16 LAB — CULTURE, BLOOD (SINGLE)

## 2014-02-23 IMAGING — CR DG CHEST 1V PORT
1 series · 1 of 1 positions shown · non-contrast
Comparison: Prior chest x-ray 07/07/2013

CLINICAL DATA: Sepsis, possible aspiration

EXAM:
PORTABLE CHEST - 1 VIEW

[ap]
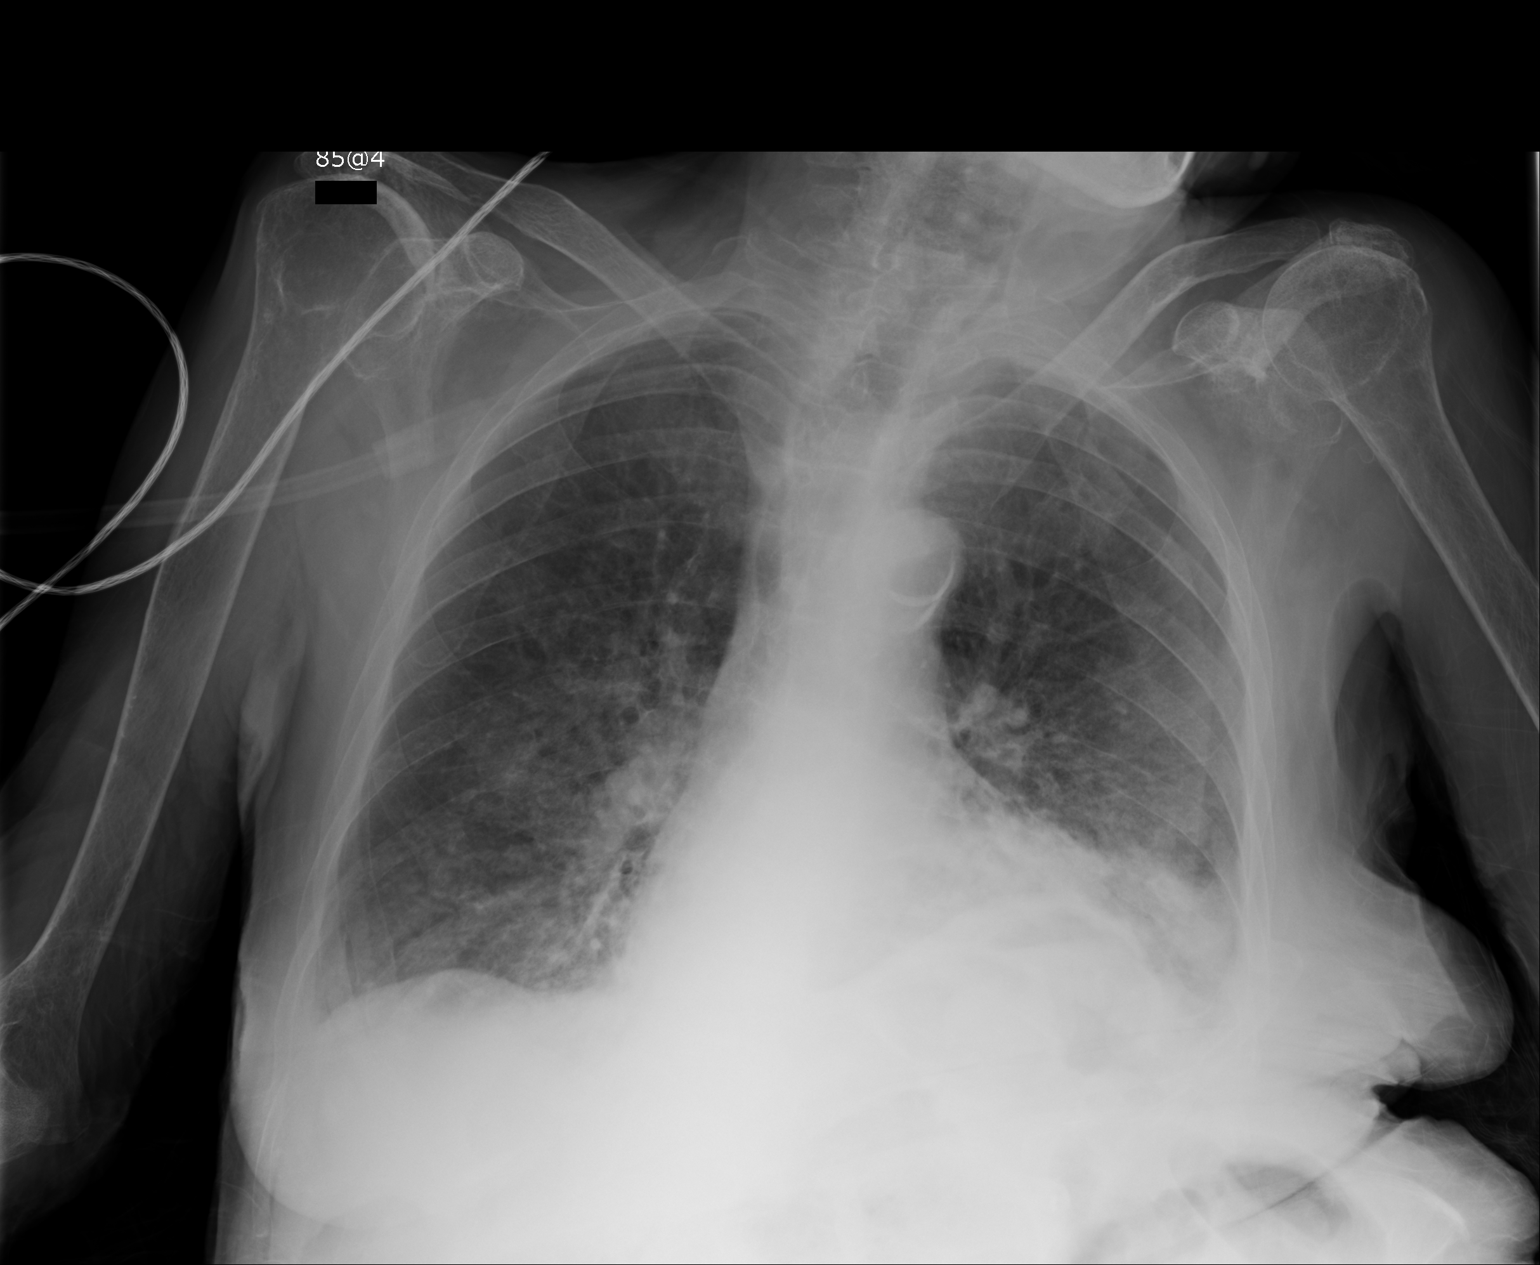

[1 of 1 positions shown; findings below may reference images not displayed]

FINDINGS: Ill-defined left lower lobe opacity superimposed on chronic
elevation of left hemidiaphragm and chronic left basilar
atelectasis. Chronic background bronchitic changes and interstitial
prominence. There may be slightly increased opacity in the right
lung base. Stable borderline cardiomegaly. Atherosclerotic
calcification noted in the transverse aorta. The bones appear
osteopenic.
IMPRESSION: 1. New left lower lobe airspace disease could reflect pneumonia, or
potentially aspiration in the appropriate clinical setting.
2. Perhaps minimally increased opacity in the right base could
reflect atelectasis or an additional focus of infiltrate/aspiration.
3. Chronic bronchitic changes and interstitial prominence.

## 2015-04-20 NOTE — Consult Note (Signed)
PATIENT NAME:  Natalie Garcia, Gavin W MR#:  [id number] DATE OF BIRTH:  02/04/1928  DATE OF CONSULTATION:  03/05/2013  REFERRING PHYSICIAN:     Alford Highlandichard Wieting, MD CONSULTING PHYSICIAN:  Rosalyn GessMichael E. Blocker, MD  REASON FOR CONSULTATION: Pneumonia and bacteremia.   HISTORY OF PRESENT ILLNESS: The patient is an 81 year old female without significant past medical history who was admitted on 03/03 with weakness, shortness of breath and cough. The patient is a relatively poor historian; although she was able to talk about how she felt currently, she was not able to give significant history of what led to her hospitalization other than to say she was admitted with pneumonia.  Per the history and physical, she was diagnosed with pneumonia 2 days prior to admission and started on Levaquin.  She did not improve and was brought to the Emergency Room.  She was hypoxic at that time with low blood pressure. She also was noted to have an elevated white blood count and a chest x-ray showed evidence for pneumonia. She was started on vancomycin and Zosyn and her sputum culture was obtained which grew methicillin sensitive Staphylococcus aureus. Her blood cultures, however, grew methicillin-resistant Staphylococcus aureus. The patient has been doing significantly better. She states she is breathing comfortably. She has no fever, chills or sweats and she is feeling fairly well.   ALLERGIES: None.   PAST MEDICAL HISTORY: 1.  Hypertension.  2.  Hypercholesterolemia.  3.  Osteoporosis.  4.  Diverticulosis.  5.  Hiatal hernia.  6.  Gastritis.  7.  Left total knee replacement.  8.  Status post cholecystectomy.  9.  Status post hysterectomy.   SOCIAL HISTORY: The patient lives at a skilled nursing facility. She gets around via wheelchair. She does not smoke. She does not drink. No history injecting drug use.   FAMILY HISTORY: Positive for stroke and a brain tumor.   REVIEW OF SYSTEMS: The patient states that she is breathing  comfortably with no fevers, chills, or sweats but otherwise is a poor historian and extensive review of systems was not possible.   PHYSICAL EXAMINATION: VITAL SIGNS: T-max 99.7, T-current 98.2, pulse 67, blood pressure 174/78, 93% on room air.  GENERAL: An 81 year old white female in no acute distress.  HEENT: Normocephalic, atraumatic. Pupils equal, reactive to light. Extraocular motion intact. Sclerae, conjunctivae and lids without evidence for emboli or petechiae. Oropharynx shows no erythema or exudate. Gums are in fair condition.  NECK: Supple. Full range of motion. Midline trachea. No lymphadenopathy. No thyromegaly.  LUNGS: Clear to auscultation bilaterally. Good air movement. No focal consolidation.  HEART: Regular rate and rhythm without murmur, rub or gallop.  ABDOMEN: Soft, nontender, and nondistended. No hepatosplenomegaly. No hernia is noted.  EXTREMITIES: No evidence for tenosynovitis.  SKIN: No rashes. No stigmata of endocarditis, specifically no Janeway lesions or Osler nodes.  NEUROLOGIC: The patient was awake and interactive, moving all 4 extremities. She was a somewhat poor historian but was pleasant and engaged.  PSYCHIATRIC: Mood and affect appeared normal.   LABORATORY AND DIAGNOSTIC DATA: BUN 18, creatinine 0.86, bicarbonate 27, anion gap 4. LFTs were unremarkable. White count of 17.1 with a hemoglobin of 11.2, platelet count of 379, ANC 12.5. White count on admission was 13.2. Blood cultures from admission are growing MRSA in 1 of 4 bottles. Sputum culture from 03/04 is growing methicillin sensitive Staphylococcus aureus.  Blood cultures from 03/05 show no growth. A transthoracic echocardiogram shows no evidence for vegetations. Chest x-ray from admission showed interstitial  infiltrate thought to be related to edema and a right mid lung pneumonia. Chest x-ray from 03/04 showed multilobar pneumonia.   IMPRESSION: An 81 year old female without significant past medical history  who is admitted with methicillin sensitive Staphylococcus aureus pneumonia and methicillin-resistant staphylococcus aureus bacteremia.   RECOMMENDATIONS: 1.  She is a nursing home resident and, therefore is at increased risk for methicillin-resistant Staphylococcus aureus.  Her chest x-ray shows definite pneumonia. The blood culture is growing a different organism than the sputum, however.  2. Will ask the lab to confirm the sensitivities of both Staphylococcus aureus isolates.  3.  We will continue vancomycin as this will cover both organisms.  4.  Fatigue. The transthoracic echo shows no vegetations. If her repeat blood cultures remain negative, then I would plan on treating for 2 weeks of IV therapy. I would not pursue a TEE at this time.   This is a high-level infectious disease consult. Thank you very much for involving me in this patient's care.     ____________________________ Rosalyn Gess. Blocker, MD meb:ct D: 03/05/2013 13:13:55 ET T: 03/05/2013 14:25:24 ET JOB#: [id number]  cc: Rosalyn Gess. Blocker, MD, <Dictator> MICHAEL E BLOCKER MD ELECTRONICALLY SIGNED 03/07/2013 8:34

## 2015-04-20 NOTE — Op Note (Signed)
PATIENT NAME:  Natalie Garcia, [name] MR#:  [id number] DATE OF BIRTH:  1928-09-06  DATE OF OPERATION:  03/07/2013  PREOPERATIVE DIAGNOSES:  1.  Methicillin-resistant Staphylococcus aureus bacteremia.  2.  Pneumonia.  3.  Need for extended intravenous antibiotics.   POSTOPERATIVE DIAGNOSES: 1.  Methicillin-resistant Staphylococcus aureus bacteremia.  2.  Pneumonia.  3.  Need for extended intravenous antibiotics.   PROCEDURES:  1. Ultrasound guidance for vascular access to right brachial vein.  2. Fluoroscopic guidance for placement of catheter.  3. Insertion of peripherally inserted central venous catheter, right arm.  SURGEON: Annice NeedyJason S. Dew, MD.   ANESTHESIA: Local.   ESTIMATED BLOOD LOSS: Minimal.   INDICATION FOR PROCEDURE: This is an 81 year old white female who was admitted with pneumonia and found to have bacteremia. She is going to beginning extended IV vancomycin, and we are asked to place a PICC line.   DESCRIPTION OF PROCEDURE: The patient's right arm was sterilely prepped and draped, and a sterile surgical field was created. The right brachial vein was accessed under direct ultrasound guidance without difficulty with a micropuncture needle and permanent image was recorded. 0.018 wire was then placed into the superior vena cava. Peel-away sheath was placed over the wire. A single lumen peripherally inserted central venous catheter was then placed over the wire and the wire and peel-away sheath were removed. The catheter tip was placed into the superior vena cava and was secured at the skin at 32 cm with a sterile dressing. The catheter withdrew blood well and flushed easily with heparinized saline. The patient tolerated the procedure well.    ____________________________ Annice NeedyJason S. Dew, MD jsd:dm D: 03/07/2013 11:43:00 ET T: 03/07/2013 13:26:15 ET JOB#: [id number]  cc: Annice NeedyJason S. Dew, MD, <Dictator> Annice NeedyJASON S DEW MD ELECTRONICALLY SIGNED 03/09/2013 14:05

## 2015-04-20 NOTE — Discharge Summary (Signed)
PATIENT NAME:  Natalie FruitVANS, Candra W MR#:  [id number] DATE OF BIRTH:  05/20/1928  DATE OF ADMISSION:  07/07/2013 DATE OF DISCHARGE:  07/08/2013.   CODE STATUS:  DNR/DNI.  DISCHARGE DIAGNOSES:  1.  Hypoglycemia.  2.  Urinary tract infection.   ADMITTING HISTORY AND PHYSICAL:  Please see detailed H and P dictated by Dr. Heron NayVasireddy. In brief, an 81 year old female patient from a nursing home with hypertension, hyperlipidemia, presented to the hospital with some dizziness and sweating. The patient was found to have low blood sugars in 54, was given a D50, but with persistent hypoglycemia was admitted to the hospitalist service on a D5. The patient was monitored on a tele floor, continued on D5, later D5 was stopped. The patient had Accu-Cheks q.1 hour with blood sugars consistently over 100. The patient is asymptomatic. She does have a UTI which could be the possible cause of hypoglycemia. Consideration was given to sepsis and other severe infections causing the hypoglycemia but the patient is afebrile and the white count is in the normal range. The patient will be placed on ciprofloxacin for 4 more days after discharge.   Today, the patient blood sugars are at 136, 116, 114 and 160 off the D5. The patient is eating well, feels back to baseline and is being discharged back to her nursing home.   DISCHARGE MEDICATIONS:  Include  1.  MiraLAX one packet oral once a day.  2.  Plavix 75 mg oral daily.  3.  Fish oil 1000 mg oral daily.  4.  Remeron 15 mg oral daily.  5.  Simvastatin 20 mg daily.  6.  Azo Cranberry oral once a day.  7.  Tylenol 325 mg 2 tablets every 4 hours as needed.  8.  Protonix 40 mg daily.  9.  Calcium vitamin D 1 tablet daily.  10.  Depakote 125 mg oral twice a day.  11.  Buspirone 15 mg oral 3 times a day.  12.  Methocarbamol 750 mg oral once a day.  13.  Loperamide 2 mg oral after each loose stool.  14.  Albuterol 3 mg inhaled every 4 hours as needed for shortness of breath,  wheezing.  15.  Allegra-D 1 tablet oral 2 times a day as needed.  16.  Atenolol 100 mg oral once a day.  17.  Paroxetine 20 mg 2 tablets oral once a day.  18.  Lasix 20 mg oral once a day.  19.  Lorazepam 0.5 mg oral every 6 hours as needed.  20.  Cipro 500 mg oral 2 times a day for 4 days.   DISCHARGE INSTRUCTIONS:  Low-sodium diet. Activity as tolerated. Follow up with primary care physician in 1 to 2 weeks. The patient will also be on Accu-Cheks before meals and at bedtime for a week. She has been requested to keep a log and take it to her doctor's appointment.   TIME SPENT ON DAY OF DISCHARGE IN DISCHARGE ACTIVITY WAS:  38 minutes.   ____________________________ Molinda BailiffSrikar R. Nai Dasch, MD srs:jm D: 07/08/2013 15:26:45 ET T: 07/08/2013 16:02:50 ET JOB#: [id number]  cc: Wardell HeathSrikar R. Chauncey Bruno, MD, <Dictator> Orie FishermanSRIKAR R Jorje Vanatta MD ELECTRONICALLY SIGNED 07/17/2013 23:39

## 2015-04-20 NOTE — H&P (Signed)
PATIENT NAME:  Natalie Garcia, Natalie W MR#:  [id number] DATE OF BIRTH:  29-Jun-1928  DATE OF ADMISSION:  02/28/2013  PRIMARY CARE PHYSICIAN:  Dr. Quillian QuinceBliss.   CHIEF COMPLAINT:  Generalized weakness with shortness of breath and cough.   HISTORY OF PRESENTING ILLNESS:  An 81 year old female patient with past medical history of hypertension, hyperlipidemia, hiatal hernia, diverticulosis, osteoporosis who presents to the hospital from skilled nursing facility with generalized weakness and cough. The patient was diagnosed with pneumonia 2 days prior and was started on Levaquin, but worsened with sats of 87% here in the Emergency Room. The patient's blood pressure was low normal with systolic blood pressure of 96. She is afebrile, but has elevated white count with chest x-ray showing right middle lobe pneumonia and is being admitted to the hospitalist service for outpatient failed pneumonia with possible multi-resistant organisms as the patient is from a skilled nursing facility.   The patient is a poor historian. She seems to have dementia.   PAST MEDICAL HISTORY:  Hypertension, hyperlipidemia, osteoporosis, diverticulosis, hiatal hernia and gastritis.   PAST SURGICAL HISTORY:   1.  [facility] and volvulus.  2.  Left total knee replacement.  3.  Cholecystectomy.  4.  Hysterectomy.  5.  Arm fracture status post repair.   ALLERGIES:  No known drug allergies.   SOCIAL HISTORY:  The patient lives at a skilled nursing facility. She is bedridden, but rolls herself in a wheelchair. Does not smoke. No alcohol. No illicit drugs.   FAMILY HISTORY:  Mother with stroke and father with brain tumor.   REVIEW OF SYSTEMS:  The patient cannot provide review of systems secondary to some dementia. Says yes to shortness of breath.   HOME MEDICATIONS:   1.  Albuterol 2 puffs every 4 hours as needed.  2.  Allegra-D 1 tablet twice a day.  3.  metorpolol 12.5 mg oral once a day.  4.   Buspirone 15 mg oral 3 times a day.  5.  Calcium and vitamin D oral once a day.  6.  Plavix 75 mg once a day.  7.  Depakote 125 mg oral 2 times a day.  8.  Fish oil 1000 mg oral once a day.  9.  Lasix 20 mg oral once a day.  10.  Levaquin 500 mg oral once a day.  11.  Loperamide 2 mg as needed for diarrhea.  12.  Lorazepam 1 mg injectable once a day as needed for anxiety and nervousness.  13.  Methocarbamol 750 mg oral once a day.  14.  Remeron 50 mg oral once a day at bedtime.  15.  Nabumetone 500 mg oral 2 times a day.  16.  Oxybutynin 5 mg oral 2 times a day.  17.  Protonix 40 mg oral once a day.  18.  Paroxetine 20 mg 2.5 tablets orally once a day.  19.  MiraLax as needed for constipation.  20.  Simvastatin 20 mg oral once a day.   PHYSICAL EXAMINATION: VITAL SIGNS: Temperature 98.6, pulse 76, respirations 20, blood pressure 90/37, saturating 87% on room air and 95% on oxygen.  GENERAL: Elderly frail Caucasian female patient lying in bed, cooperative with exam.  PSYCHIATRIC: Alert, awake. Cannot test for her orientation. Has a flat depressed affect.  HEENT: Atraumatic, normocephalic. Oral mucosa dry and pink. External ears and nose normal. No pallor. No icterus. Pupils bilaterally equal and reactive to light.  NECK: Supple. No thyromegaly. No palpable  lymph nodes. Trachea midline. No JVD.  CARDIOVASCULAR: S1 and S2 with a murmur. Peripheral pulses 2+. No edema.  RESPIRATORY: Has crackles on the right side. Decreased air entry at the right base.  GASTROINTESTINAL: Soft abdomen, nontender. Bowel sounds present. No hepatosplenomegaly palpable. She has pain pump inserted in the abdominal wall.  GENITOURINARY: No CVA tenderness, bladder distention.  SKIN: Warm and dry. No petechiae, rash, ulcers.  MUSCULOSKELETAL: No joint swelling, redness, effusion of the large joints. Normal muscle tone.  NEUROLOGICAL: Motor strength 4/5 in upper and lower extremities. Sensation to fine touch intact  all over.  LYMPHATIC: No cervical lymphadenopathy.   DIAGNOSTIC DATA:  Glucose 85, BUN 22, creatinine 1.07, albumin 2.4. Troponin is less than 0.02. WBC is 13.2, hemoglobin 10.4, platelets of 214. Urinalysis shows 11 WBCs and trace bacteria. Chest x-ray shows right middle lobe infiltrate.   ASSESSMENT AND PLAN:   1.  Right middle lobe pneumonia, likely bacterial, which has failed outpatient antibiotics. The patient is from skilled nursing facility putting her at high risk for multidrug-resistant organisms. We will start her on vancomycin, Zosyn and Levaquin. We will get blood and sputum cultures, oxygen as needed and nebulizers p.r.n. The patient does have an elevated white count. We will follow the white count and repeat a chest x-ray in the morning.  2.  Urinary tract infection. Continue antibiotics, await cultures.  3.  Acute hypoxic respiratory failure due to above.  4.  Dysphagia. The patient has had a hiatal hernia in the past and also had 1 episode of vomiting in the skilled nursing facility that raises concern for possible aspiration pneumonia. The patient is on a mechanical soft diet. We will get a swallow evaluation and the antibiotics should cover for aspiration pneumonia.  5.  Hypertension. The patient presently has borderline low blood pressure. We will hold her blood pressure medications.  6.  Dementia-Watch out for any inpt delirium.  7.  Deep venous thrombosis prophylaxis with heparin.  8.  Anemia, chronic, stable around 10.3.   CODE STATUS:  DNR/DNI.   TIME SPENT:  On this case was 60 minutes with more than 50% time spent in coordination of care.   ____________________________ Molinda Bailiff. Sudini, MD srs:si D: 02/28/2013 16:04:00 ET T: 02/28/2013 16:48:33 ET JOB#: [id number]  cc: Wardell Heath R. Elpidio Anis, MD, <Dictator> Burley Saver, MD Orie Fisherman MD ELECTRONICALLY SIGNED 03/01/2013 14:03

## 2015-04-20 NOTE — Consult Note (Signed)
PATIENT NAME:  Natalie Garcia, Natalie Garcia MR#:  [id number] DATE OF BIRTH:  June 12, 1928  DATE OF CONSULTATION:  03/02/2013  REFERRING PHYSICIAN:  Dr. Allena Katz CONSULTING PHYSICIAN:  Hardie Shackleton. Colin Benton, PA-C; Ezzard Standing. Bluford Kaufmann, MD  REASON FOR CONSULTATION: Dysphagia.  HISTORY OF PRESENT ILLNESS: This is a pleasant 81 year old female seen in consultation at the request of Dr. Allena Katz for evaluation of possible dysphagia/aspiration pneumonia. This is an 81 year old female who has been residing at a skilled nursing facility who had failed outpatient treatment of community-acquired pneumonia and was therefore admitted for inpatient treatment. Currently at bedside is her son, who is able to provide most of the history, as she does have some memory loss/dementia and is a poor historian. The son is able to provide most of the below history. She began feeling short of breath with a productive cough and upon further workup, she was found to have a right mid lung pneumonia. Gram stain with blood and sputum cultures have shown Gram-positive cocci clusters and Staph aureus organisms. She is currently on Levaquin, Zosyn and vancomycin as far as antibiotics. She has also been given pantoprazole and Zofran as well. There were suspicions that this may be secondary to an aspiration pneumonia, as she is bedridden and she did have 1 episode of emesis while at the nursing facility prior to admission. Since being admitted to the hospital, according to the patient's son and the nurse, she has not had any further nausea, vomiting-like symptoms. She is currently tolerating a mechanical soft diet without complaints of discomfort or pain. There are no complaints of dysphagia either. There is no current fever or chills. There is some degree of shortness of breath, but there is no current chest pain. No unintentional weight changes. She did undergo a modified barium swallow this morning, 03/02/2013, just a few hours ago by speech pathology and this report is still  currently pending. Important to note, according to the patient's son, was she had an abdominal surgery requiring hiatal hernia repair and some sort of volvulus at [facility]; however, the son is unsure of the details of what exactly went on during this procedure. He states he has been able to watch her eat on a daily basis and has been stating that she has a great appetite, has been eating well and has not been complaining of any discomfort. Bowel movements have been unaffected, denying any diarrhea, constipation, bright red blood per rectum or melena. No other current concerns or complaints. The patient is drowsy and sleepy, however, easily arousable.   PAST MEDICAL HISTORY: Chronic back pain requiring a pain pump, hypertension, dyslipidemia, osteoporosis, dementia, hiatal hernia, gastritis, CVA/TIA 3 years ago and diverticulosis.  PAST SURGICAL HISTORY: A hiatal hernia repair/possible volvulus surgery done at [facility] several years ago, according to the patient's son; however, the details are unclear. Left total knee replacement, hysterectomy, arm fracture repair and cholecystectomy.  ALLERGIES: No known drug allergies.     SOCIAL HISTORY: The patient resides at a skilled nursing facility. There is no alcohol, tobacco or illicit drug use. She has 2 sons, who are very involved as caregivers as well.  FAMILY HISTORY: Her father had a brain tumor. Mother had a CVA. There is no known family history of GI malignancy or IBD.   REVIEW OF SYSTEMS: Difficult to obtain a real review of systems due to her dementia and current tiredness. However, it is clear that she is short of breath and there has not been nausea or vomiting. The son was  able to obtain the remainder of the review of systems as best as possible.  HOME MEDICATIONS: Albuterol 2 puffs every 4 hours p.r.n., Allegra-D 1 tablet twice daily, metoprolol 12.5 mg once a day, buspirone 15 mg 3 times a day, calcium and vitamin D oral once a day, Plavix 75 mg once a  day, Depakote 125 mg oral twice a day, fish oil 1000 mg oral once a day, Lasix 20 mg oral once a day, Levaquin 500 mg oral once a day, loperamide 2 mg p.r.n. diarrhea, lorazepam 1 mg once a day as needed for anxiety, methocarbamol 750 mg once a day, Remeron 15 mg once a day, nabumetone 500 mg twice a day, oxybutynin 5 mg twice a day, Protonix 40 mg once a day, paroxetine 20 mg daily, MiraLax as needed for constipation, simvastatin 20 mg once a day.  PHYSICAL EXAMINATION:  VITAL SIGNS: Blood pressure 148/73, pulse 80, respirations 20, temperature 97.7. Bedside pulse ox is 98% on 2 liters of oxygen. GENERAL: This is a pleasant 81 year old female, accompanied by her son at bedside, who is sleepy and drowsy, however, easily arousable. She is alert. Difficult to tell if she is oriented. However, she does not appear to be in any acute distress. HEAD: Atraumatic, normocephalic. NECK: Supple. No lymphadenopathy noted. HEENT: Sclerae anicteric. Mucous membranes moist. Nasal cannula noted. PULMONARY: Respirations are even and unlabored. However, bilateral crackles more so on the right side are noted. Lungs do sound congested. She is having a productive cough of green-colored sputum while I am in the patient's room.  CARDIAC: Regular rate and rhythm, S1, S2 noted. ABDOMEN: Soft, nontender, nondistended. Normoactive bowel sounds are noted in all 4 quadrants. No masses palpated. No guarding or rebound. No hernias appreciated.  RECTAL: Deferred. EXTREMITIES: Negative for lower extremity edema, 2+ pulses noted bilateral upper extremities. NEUROLOGIC: Appropriate mood and affect. Alert. Difficult to tell if oriented because she is quite drowsy.   LABORATORY DATA:  White blood cells 12.5, hemoglobin 10.2, hematocrit 31.9, platelets 233. Sodium 140, potassium 3.8, BUN 18, creatinine 0.86, glucose 82, MCV 84. Gram stain and blood culture and sputum culture: Gram-positive cocci clusters and positive for Staph  aureus.  IMAGING: A modified barium swallow was obtained on the patient just a few hours ago and we are awaiting these results.  Chest x-ray was notable for bilateral diffuse interstitial thickening with patchy alveolar air-space opacities. Right mid lung pneumonia was also noted.  ASSESSMENT:  1. Right mid lung pneumonia with accompanying shortness of breath with Gram stain and culture notable for gram-positive cocci clusters and Staphylococcus aureus. Currently on Levaquin, Zosyn and vancomycin for antibiotics.  2.  One episodes of emesis at her skilled nursing facility prior to presentation. There was some concern that this may have been an aspiration pneumonia. 3. History of a hiatal hernia, [facility]. The details of this surgery are currently unclear. 4.  Dementia.   PLAN:  I have discussed this patient's case in detail with Dr. Lutricia FeilPaul Oh, who was involved in the development of the patient's plan of care. At this time, according to the patient's son, she does seem to be tolerating a mechanical soft diet quite well without any further nausea, vomiting, dysphagia, abdominal discomfort or trouble with her appetite. Therefore, we would like to await the findings of the modified barium swallow that was done this morning, 03/02/2013, and make further recommendations from there. Should it appear that she  is having trouble swallowing/aspiration, we can certainly consider whether or not she can benefit from tube feedings at that time. Continue antibiotics as recommended by pulmonology and internal medicine. Continue symptomatic management. We do agree with the patient being maintained on pantoprazole and Zofran as well. We will continue to monitor this patient throughout hospitalization and make further recommendations pending the MBS and per clinical course.  Thank you so much for this consultation and allowing [place] to participate in  the patient's plan of care.    ____________________________ Hardie Shackleton. Earle, PA-C [id number] D: 03/03/2013 14:57:00 ET T: 03/03/2013 16:03:56 ET JOB#: [id number]  cc: Hardie Shackleton. Earle, PA-C, <Dictator> Hardie Shackleton EARLE PA ELECTRONICALLY SIGNED 03/07/2013 12:06

## 2015-04-20 NOTE — Consult Note (Signed)
Chief Complaint:  Subjective/Chief Complaint Again, patient offers no complaints, but according to nurse, oral intake poor.Marland Kitchen   VITAL SIGNS/ANCILLARY NOTES: **Vital Signs.:   07-Mar-14 06:15  Vital Signs Type Post Medication  Systolic BP Systolic BP 172  Diastolic BP (mmHg) Diastolic BP (mmHg) 70  Mean BP 104  BP Source  if not from Vital Sign Device manual   Brief Assessment:  Cardiac Regular   Respiratory clear BS   Gastrointestinal Normal   Lab Results: LabObservation:  06-Mar-14 14:56   OBSERVATION Reason for Test  Cardiology:  06-Mar-14 14:56   Echo Doppler REASON FOR EXAM:     COMMENTS:     PROCEDURE: [facility] - ECHO DOPPLER  - Mar 03 2013  2:56PM   RESULT: Echocardiogram Report  Patient Name:   [facility] Date of Exam: 03/03/2013 Medical Rec #:  [id number]           Custom1: Date of Birth:  10/31/1928         Height:       60.0 in Patient Age:    81 years         Weight:       104.3 lb Patient Gender: F                BSA:          1.42 m??  Indications: Endocarditis Sonographer:    Nestor Ramp RCS Referring Phys: Milagros Loll, R  2D AND M-MODE MEASUREMENTS (normal ranges within parentheses): Left Ventricle:          Normal    Aorta/Left Atrium:    Normal IVSd (2D):      0.86 cm (0.7-1.1) Aortic Root (Mmode):  (2.4-3.7) LVPWd (2D):     0.80 cm (0.7-1.1) AoV Cusp Separation:  (1.5-2.6) LVIDd (2D):     4.22 cm (3.4-5.7) Left Atrium (Mmode):  (1.9-4.0) LVIDs (2D):     3.05 cm LV FS (2D):     27.7 %   (>25%)   Aorta/LA:                  Normal LV EF (2D):     54.1 %   (>50%)   Aortic Root (2D): 3.20 cm (2.4-3.7)                          Left Atrium (2D): 3.50 cm (1.9-4.0)                                    Right Ventricle:                                   RVd (2D): LV DIASTOLIC FUNCTION: MV Peak E: 1.47 m/s E/e' Ratio: 10.70 MV Peak A: 1.20 m/s Decel Time: 243 msec E/A Ratio: 0.70 SPECTRAL DOPPLER ANALYSIS (where applicable): Mitral Valve: MV P1/2 Time:  70.47 msec MV Area, PHT: 3.12 cm?? Tricuspid Valve and PA/RV Systolic Pressure: TR Max Velocity: 3.74 m/s RA  Pressure: 5 mmHg RVSP/PASP: 61.0 mmHg  PHYSICIAN INTERPRETATION: Left Ventricle: The left ventricular internal cavity size was normal. LV  posterior wall thickness was normal. Global LV systolic function was  normal. Left ventricular ejection fraction, by visual estimation, is 55  to 60%. Spectral Doppler shows impaired relaxation pattern of LV   diastolic filling.  Right Ventricle: The right ventricular size is normal. RV wall thickness  is normal. Global RV systolic function is normal. Left Atrium: The left atrium is mildly dilated. Right Atrium: The right atrium is mildly dilated. Mitral Valve: There is mild. No evidence of mitral valve stenosis. Mild  to moderate mitral valve regurgitation is seen. Tricuspid Valve: Mild to moderate tricuspid regurgitationis visualized.  The tricuspid regurgitant velocity is 3.74 m/s, and with an assumed right  atrial pressure of 5 mmHg, the estimated right ventricular systolic  pressure is moderately elevated at 61.0 mmHg. Aortic Valve: The aortic valve is tricuspid. Mild aortic valve sclerosis  is present, with no evidence of aortic valve stenosis. Pulmonic Valve: The pulmonic valve is not well seen. Trace pulmonic valve  regurgitation. Venous: The pulmonary veins were not well seen. The inferior vena cava  was normal sized with respiratory size variation greater than 50%.  Summary:  1. Left ventricular ejection fraction, by visual estimation, is 55 to  60%.  2. Normal global left ventricular systolic function.  3. Impaired relaxation pattern of LV diastolic filling.  4. Mildly dilated left atrium.  5. Mildly dilated right atrium.  6. Mild to moderate mitral valve regurgitation.  7. Mild aortic valve sclerosis without stenosis.  8. Moderately elevated pulmonary artery systolic pressure.  9. Mild to moderate tricuspid  regurgitation. 10. The valves were well seen without evidence of vegetations. [id number] Lorine BearsMuhammad Arida MD Electronically signed by [id number] Lorine BearsMuhammad Arida MD Signature Date/Time: 03/04/2013/7:59:46 AM  *** Final ***  IMPRESSION: .    Verified By: Chelsea AusMUHAMMAD A. Kirke CorinARIDA, M.D., MD   Assessment/Plan:  Assessment/Plan:  Assessment Aspiration pneumonia.   Plan Encourage oral intake. Consider daily caloric count. Will sign off. HOwever, if po intake remains poor and we have to consider feeding tube, let us know. Thanks   Electronic Signatures: Lutricia Feilh, Haydin Calandra (MD)  (Signed 07-Mar-14 10:50)  Authored: Chief Complaint, VITAL SIGNS/ANCILLARY NOTES, Brief Assessment, Lab Results, Assessment/Plan   Last Updated: 07-Mar-14 10:50 by Lutricia Feilh, Lilja Soland (MD)

## 2015-04-20 NOTE — Discharge Summary (Signed)
PATIENT NAME:  Natalie Garcia, Natalie Garcia MR#:  [id number] DATE OF BIRTH:  [birth year]  DATE OF ADMISSION:  02/28/2013 DATE OF DISCHARGE:  03/08/2013  PRIMARY CARE PHYSICIAN:  Dr. Quillian QuinceBliss.   FINAL DIAGNOSES: 1.  Methicillin-resistant Staphylococcus aureus bacteremia.  2.  Methicillin-sensitive Staphylococcus aureus  pneumonia.  3.  Leukocytosis. 4.  Esophageal stricture; hiatal hernia.  5.  Malignant hypertension.  6.  History of cerebrovascular accident.  7.  Malnutrition.  8.  Gastroesophageal reflux disease.  9.  Hyperlipidemia.  10.  Urinary incontinence.   MEDICATIONS ON DISCHARGE: Include MiraLAX 17 grams in 8 ounces of water every other day, Paxil 20 mg daily, Plavix 75 mg daily, fish oil 1000 mg daily, oxybutynin 5 mg every 12 hours, nabumetone 500 mg twice a day, Remeron 15 mg at bedtime, simvastatin 20 mg bedtime, Q-PAP which is Tylenol 325 mg 2 tablets every 4 hours as needed for pain or fever, Azo-Cranberry oral tablet 1 a day, Protonix 40 mg daily, calcium and vitamin D 500 mg to 200 international units 1 tablet daily, Depakote 125 mg oral delayed-release tablet 1 tablet twice a day, BuSpar 15 mg 3 times a day, methocarbamol 750 mg daily, loperamide 2 mg after each stool; no more than 8 doses in 24 hours, albuterol 3 mL nebulizer every 4 hours as needed for shortness of breath, Allegra D 60 mg twice a day, lisinopril 20 mg daily, lorazepam 0.5 mg every 8 hours as needed for anxiety or nervousness, atenolol 100 mg daily, vancomycin 750 mg IV every 12 hours for 7 days.   Line flush with normal saline and heparin 5 mL injectable daily and also as needed.   PICC line care and flushes.   DIET: Low sodium diet. Diet consistency: Mechanical soft, thin liquids, aspiration precautions, medications in puree as necessary. Gravy added to chopped meats. Supplemental drink, moistened food, eat slowly.   FOLLOWUP: with Dr. Leavy CellaBlocker on Friday. He will check a CBC at that time. I do recommend checking vancomycin  troughs.   Followup in 1 to 2 days with Dr. Quillian QuinceBliss at facility.   HOSPITAL COURSE:  The patient was admitted 02/28/2013, discharged 03/08/2013. The patient came in with generalized weakness, shortness of breath and cough. Was found to have a pneumonia. Was started on vancomycin, Zosyn and Levaquin. A GI consultation was obtained. Speech pathology consultation was obtained, and Infectious Disease consultation was obtained. Dr. Bluford Kaufmannh saw with Gastroenterology and recommended conservative management. Speech pathology changed the diet.   LABORATORY AND RADIOLOGICAL DATA DURING THE HOSPITAL COURSE: Included 1 blood culture that was negative. Valproic acid was 24. Troponin negative. Glucose 85, BUN 22, creatinine 1.07, sodium 137, potassium 3.9, chloride 101, CO2 30, calcium 9.2. Liver function tests normal range.   White blood cell count 13.2, H and H 10.4 and 32.9, platelet count of 214.   EKG shows sinus rhythm, short PR.   Urinalysis 1+ leukocyte esterase.   Chest x-ray shows interstitial infiltrate, right mid-pneumonia pneumonia. One of the blood cultures came out MRSA in aerobic bottle only. Sputum culture grew out staph aureus, which was sensitive. Repeat blood cultures were negative.   Echocardiogram:  EF 55% to 60%, mild dilation of the left atrium and right atrium, moderate mitral valve regurgitation, aortic valve sclerosis, increased pulmonary artery systolic pressure;  no vegetations.   White count on March 8th was 17.1. White count on March 11th 24.7, on repeat 24.8.   HOSPITAL COURSE PER PROBLEM LIST:  1.  For the MRSA bacteremia  the patient was on vancomycin as per Dr. Leavy Cella. Will need 2 weeks of IV vancomycin.  2.  For the MSSA pneumonia, the patient was initially started on aggressive antibiotics with Levaquin, Zosyn, and vancomycin was brought down to vancomycin only with the MRSA bacteremia results. Dr. Leavy Cella is going to have them re-do the sensitivities on these organisms. So far  it is MRSA bacteremia, MSSA pneumonia in the sputum culture.  3.  Leukocytosis, increasing:  The patient is not complaining of anything, actually it looks good. Lungs were clear upon discharge. Dr. Leavy Cella would like to follow up on Friday and check a white count in the office and evaluate closely.  4. For esophageal stricture and hiatal hernia, speech adjusted the diet and Dr. Bluford Kaufmann said conservative management; the patient is on Protonix.  5. Malignant hypertension:  The patient's blood pressure upon discharge, 153/66. Her medications needed to be titrated up while here, currently on atenolol 100 mg and lisinopril 20 mg.  6.  History of cerebrovascular accident:  On Plavix.  7.  Malnutrition.   8.  Gastroesophageal reflux disease:  On Protonix.  9.  Hyperlipidemia:  The patient is on fish oil and simvastatin.  10.  Urinary incontinence:  On oxybutynin.   Close clinical followup as outpatient needed.   Time spent on discharge:  40 minutes.     ____________________________ Herschell Dimes. Renae Gloss, MD [id number] D: 03/08/2013 12:10:00 ET T: 03/08/2013 12:28:55 ET JOB#: [id number]  cc: Burley Saver, MD Herschell Dimes. Renae Gloss, MD, <Dictator>  Salley Scarlet MD ELECTRONICALLY SIGNED 03/10/2013 18:57

## 2015-04-20 NOTE — H&P (Signed)
PATIENT NAME:  Natalie Garcia, OLSEN MR#:  [id number] DATE OF BIRTH:  [birth year]  DATE OF ADMISSION:  07/07/2013  PRIMARY CARE PHYSICIAN:  Burley Saver, MD  REFERRING PHYSICIAN:  Samantha Bullard, PA-C  CHIEF COMPLAINT:  Low blood sugars.   HISTORY OF PRESENT ILLNESS:  The patient is an 81 year old pleasant white female with past medical history of hypertension, hyperlipidemia, dementia that presented to the Emergency Department with confusion.  The patient ate a good lunch when the patient's son was present.  In the evening went to eat supper, after eating the supper as the patient was walking towards her room, started to experience diaphoresis, confusion.  Concerning this, checked her blood sugars and was found to have 54.  The patient was given D50 with significant improvement in the mental status.  Considering this, EMS was called and was brought to the Emergency Department.  In the Emergency Department, the patient had another episode of hypoglycemia and again responded to the D50.  The patient denied having any chest pain, palpitations.  The patient denies having history of diabetes mellitus.  No previous history of hypoglycemia.  Work-up in the Emergency Department, the patient was found to have a mild urinary tract infection, has elevated BUN and creatinine ratio of greater than 20.  The patient does not share a room with another resident.  Per the family, it is very unlikely that the patient might have had an accidental dose of hyperglycemic agents.   PAST MEDICAL HISTORY: 1.  Hypertension.  2.  Hyperlipidemia.  3.  Osteoporosis.  4.  Diverticulosis.  5.  Hiatal hernia.  6.  Gastroesophageal reflux disease.   PAST SURGICAL HISTORY: 1.  Abdominal surgery at [place] for hiatal hernia and volvulus.  2.  Left total knee replacement.   3.  Cholecystectomy.  4.  Hysterectomy.  5.  Arm fracture, status post repair.   ALLERGIES:  No known drug allergies.   HOME MEDICATIONS: 1.  Simvastatin 20  mg daily.  2.  Q-PAP 650 mg every 4 hours as needed.  3.  Polyethylene glycol as needed.  4.  Paroxetine 20 mg once a day.  5.  Protonix 40 mg daily.  6.  Mirtazapine 15 mg once a day.  7.  Methocarbamol 750 mg 1 tablet once a day.  8.  Lorazepam 0.5 mg every 8 hours as needed.  9.  Loperamide 2 mg as needed.  10.  Lasix 20 mg daily.  11.  Fish oil 1000 mg once a day.  12.  Depakote 125 mg 2 times a day.  13.  Plavix 75 mg once a day.  14.  Calcium with vitamin D once a day.  15.  Bupropion 15 mg 1 tablet 3 times a day.  16.  Atenolol 100 mg once a day.  17.  Allegra 60 mg 1 tablet 2 times a day.  18.  Albuterol inhaler every 4 hours as needed.   SOCIAL HISTORY:  Denies smoking, drinking alcohol or using illicit drugs.  The patient is wheelchair-bound, does not walk, lives in a skilled nursing facility.   FAMILY HISTORY:  Mother with stroke.  Father with brain tumor.   REVIEW OF SYSTEMS: CONSTITUTIONAL:  Denies any generalized weakness. Had weight loss in the past when had urinary tract infection, however gained back the weight.  EYES:  No change in vision.   EARS:  No change in hearing.  No tinnitus.  RESPIRATORY:  No cough, shortness of breath.  CARDIOVASCULAR:  No  chest pain, palpitations.  GASTROINTESTINAL:  No nausea, vomiting, abdominal pain. GENITOURINARY:  No dysuria or hematuria.  ENDOCRINE:  No polyuria or polydipsia.  MUSCULOSKELETAL:  Has chronic pain on chronic pain pump.  NEUROLOGIC:  No weakness or numbness in any part of the body.  PSYCHIATRIC:  Has history of depression.   PHYSICAL EXAMINATION: GENERAL:  This is a frail-looking female lying down in the bed, not in distress.  VITAL SIGNS:  Temperature 97.6, pulse 64, blood pressure 145/66, respiratory rate of 16, oxygen saturation is 94% on room air.  HEENT:  Head normocephalic, atraumatic.  Eyes, no scleral icterus.  Conjunctivae normal.  Pupils equal and react to light.  Extraocular movements intact.  Mucous  membranes, mild dryness.  No pharyngeal erythema.  NECK:  Supple.  No lymphadenopathy.  No JVD.  No carotid bruit.  No thyromegaly.  CHEST:  Has no focal tenderness.  LUNGS:  A few bibasilar crackles are heard.  HEART:  S1, S2 regular.  No murmurs are heard.  No pedal edema.  Pulses are 2+.  ABDOMEN:  Bowel sounds plus.  Soft, nontender, nondistended.  No hepatosplenomegaly.  SKIN:  No rash or lesions.  MUSCULOSKELETAL:  Good range of motion in all the extremities.  NEUROLOGIC:  The patient is alert, oriented to place, person and is able to tell the month.  Could not tell the year and date.  No cranial nerve abnormalities.  Motor 5/5 in upper and lower extremities.   LABORATORY AND DIAGNOSTIC DATA:  CBC: WBC of 12.8, hemoglobin 10.4, platelet count of 434.  CMP: BUN 23, creatinine of 0.67, glucose 58.  The rest of all the values are within normal limits.   Chest x-ray, one view portable: No acute cardiopulmonary disease.  Mild bibasilar opacities likely secondary to atelectasis.   UA: 1+ leukocyte esterase, WBC 7, negative for nitrites and leukocyte esterase.   CT head without contrast: No acute intracranial abnormality.  Chronic small vessel ischemic disease.   ASSESSMENT AND PLAN:  The patient is an 81 year old female who comes to the Emergency Department with hypoglycemia.  1.  Hypoglycemia.  The differential diagnosis, decreased by mouth intake, which seems to be unlikely.  The patient was witnessed to have one good meal. Could be chronic renal insufficiency causing her to have accumulation of the insulin and causing hypoglycemia, or insulinoma; however, this is the only episode, or accidental dose of hyperglycemic agents.  We will continue with the D5.  If her blood sugars are normal, the patient could be discharged home.  2.  Urinary tract infection.  We will obtain urine cultures.  Keep the patient on Rocephin; however, the patient is negative for nitrites and leukocyte esterase.  This  could be asymptomatic bacteriuria.  3.  Chronic pain.  The patient is on pain pump.  4.  Hypertension, currently well-controlled.  5.  Keep the patient on DVT prophylaxis with Lovenox.    TIME SPENT:  40 minutes.    ____________________________ Susa GriffinsPadmaja Cristoval Teall, MD pv:ea D: 07/08/2013 03:03:57 ET T: 07/08/2013 04:17:18 ET JOB#: [id number]  cc: Susa GriffinsPadmaja Nataleigh Griffin, MD, <Dictator> Burley SaverL. Katherine Bliss, MD  Susa GriffinsPADMAJA Nicha Hemann MD ELECTRONICALLY SIGNED 07/13/2013 0:21

## 2015-04-20 NOTE — Consult Note (Signed)
PATIENT NAME:  Natalie Garcia, Natalie Garcia MR#:  [id number] DATE OF BIRTH:  [birth year]  DATE OF CONSULTATION:  03/02/2013  REFERRING PHYSICIAN:  Dr. Allena Katz CONSULTING PHYSICIAN:  Dr. Noralee Chars M. Earle, PA-C  REASON FOR CONSULTATION: Dysphagia.   HISTORY OF PRESENT ILLNESS: The patient is an 81 year old female seen at the request of Dr. Allena Katz for the indication of dysphagia. She does have a past medical history significant for dementia; and, therefore, history is difficult to obtain, however her son is present at bedside and is able to provide much of the following history.  She does reside at a skilled nursing facility where she was found recently to have a pneumonia that had failed outpatient treatment.  She was, therefore, recommended to come into the hospital for inpatient intravenous antibiotics and further management in this regard.  She had one episode of emesis at the facility with no evidence of blood.  According to the son, other caregivers in the skilled nursing facility, there have been no other observed episodes of emesis.  The son states that he is able to witness her eating on a regular basis, and she currently tolerates a mechanical soft diet and denies any dysphagia, nausea, vomiting or any other discomfort.  She denies any heartburn.  Her pneumonia has given her quite a productive cough that is producing green sputum.  This was cultured and came back as gram-positive cocci as well as gram stain noting staph aureus.  There were suspicions that this may have been related to an aspiration pneumonia; and, therefore, a modified barium swallow was obtained on the patient this morning.  Other than the above-mentioned symptoms of shortness of breath and cough, there is no abdominal pain, no nausea or vomiting, no weight changes, no dysphagia.  No fever or chills.    ALLERGIES:  No known drug allergies.  PAST MEDICAL HISTORY: Hypertension, dyslipidemia, osteoporosis, diverticulosis, hiatal hernia, chronic  back pain and a history of CVA.  PAST SURGICAL HISTORY: Approximately 2 years ago, the patient had an abdominal surgery at [facility] for a hiatal hernia and possible volvulus.  The details of this abdominal surgery are unclear.  Also, history of a left total knee replacement, cholecystectomy, hysterectomy and arm fracture, status post repair.   SOCIAL HISTORY: The patient resides at a skilled nursing facility and does also have 2 sons who are very involved as caregivers as well.  She is bedridden but is able to occasionally get up and be mobile in a wheelchair.  She denies any alcohol, tobacco or illicit drug use.  The son agrees with this.    FAMILY HISTORY: Mother had a CVA.  Father had a brain tumor.  No other known family history of GI malignancy or colon polyps.   HOME MEDICATIONS: Albuterol 2 puffs every 4 hours, Allegra-D 1 tablet b.i.d., Metoprolol 12.5 mg once a day, buspirone 15 mg 3 times a day, calcium and vitamin D once a day, Plavix 75 mg once a day, Depakote 125 mg twice a day, fish oil 1000 mg once a day, Lasix 25 mg once a day, Levaquin 500 mg once a day, Loperamide 2 mg p.r.n. for diarrhea., Lorazepam 1 mg injectable once daily, methocarbamol 750 mg once a day, Remeron 50 mg once a day, nabumetone 500 mg twice a day, Oxybutynin 5 mg twice a day, Protonix 40 mg once a day, paroxetine 20 mg, 2.5 mg tablets once a day, MiraLAX p.r.n., Simvastatin 20 mg once a day.   Pertinent inpatient medications to  our service include heparin, Plavix, Levaquin, Zosyn, vancomycin, pantoprazole and Zofran p.r.n.    REVIEW OF SYSTEMS: Review of systems is difficult to obtain as the patient does have some dementia and is difficult to have a discussion with.  All pertinent positives that were able to be acquired from the patient's son are mentioned above.   PHYSICAL EXAMINATION:  VITAL SIGNS: Blood pressure 148/73, pulse 80, respirations 20, temperature 97.7.  Pulse ox is 98% on 2 liters of oxygen.  GENERAL:  This is an 81 year old female, alert, however difficult to tell if she is oriented, in no acute distress.  HEENT: Head: Atraumatic, normocephalic. ENT: Sclerae anicteric.  Mucous membranes moist.  Currently has nasal cannula.  NECK:  No lymphadenopathy.   PULMONARY: Respirations are even and unlabored. Bilateral congestion is noted with increased crackles and congestion on the right side.  CARDIAC: Regular rate and rhythm, S1, S2 noted.   ABDOMEN: Soft, nontender, nondistended. Normoactive bowel sounds heard in all 4 quadrants. No masses palpated.  No guarding or rebound.   EXTREMITIES: Negative for lower extremity edema.  2+ pulses noted bilaterally.   RECTAL: Deferred.    LABORATORY, DIAGNOSTIC AND RADIOLOGICAL DATA:     White blood cells 12.5, hemoglobin 10.2, hematocrit 31.9, platelets 233.  Sodium 148, potassium 3.8, BUN 18, creatinine 0.86, glucose is 82, MCV is 84.  Gram stain did reveal gram-positive cocci in clusters as well as staph aureus.   Imaging: Chest x-ray as seen on the patient chart is showing bilateral diffuse interstitial thickening with patchy alveolar air space opacities, also a right mid lung pneumonia.   A modified barium swallow was obtained on the patient just a few hours ago, and this report is still pending.   ASSESSMENT:  1. Right middle lobe pneumonia, likely bacterial: Currently on vancomycin, Zosyn and Levaquin.  2.  Dysphagia, questionable:  The patient does have a history of hiatal hernia requiring [facility]; however, the details of this are unclear. The patient is currently tolerating a mechanical soft diet, and she did have a modified barium swallow done earlier this morning.  Per the son, the patient has not been having any trouble tolerating the above diet without any signs of digestive complaints, dysphagia, nausea or vomiting. 3.  Dementia: The patient is a poor historian.  4.  Anemia: Normocytic and appears to be chronic. Hemoglobin is  remaining stable.  CODE STATUS:  DNR, DNI.     PLAN: I have discussed this patient's case in detail with Dr. Lutricia FeilPaul Oh, who is involved in the development of the patient's plan of care.  At this time, we are comfortable with the patient being on a mechanical soft diet as she has been able to tolerate this without difficulty.  There has been no further emesis; however, we are recommending that she be maintained on Protonix, and we can keep antiemetics available p.r.n. Continue antibiotics per recommendations of Pulmonary and Internal Medicine.  We do recommend using caution and preventing aspiration risks by elevating the head of the bed when she is eating or drinking.  Continue to monitor her H and H for any decline; however, it is currently remaining stable.  We would like to evaluate the patient's recent modified barium swallow before we can make a decision on where to proceed and whether or not this is related to an aspiration pneumonia or not.  Should there be some abnormality in the above exam, then we can certainly consider whether or not she  could benefit from supplemental tube feeds.  Also, we would like to request records from [facility] from her prior esophageal surgery as the details of this are unclear.  The above plan of care was discussed in detail with the patient and the patient's son, who is present at bedside, who verbalized understanding and are in agreement.  Further recommendations per clinical course.  The above plan of care was discussed and agreed upon under a supervisory agreement between myself and Dr. Lutricia Feil.  Thank you so much for this consultation and for allowing [place] to participate in the patient's plan of care.   ____________________________ Hardie Shackleton. Earle, PA-C kme:cb D: 03/02/2013 13:59:03 ET T: 03/02/2013 15:06:15 ET JOB#: [id number]  cc: Hardie Shackleton. Earle, PA-C, <Dictator> Hardie Shackleton EARLE PA ELECTRONICALLY SIGNED 03/07/2013 12:04

## 2015-04-20 NOTE — Consult Note (Signed)
Impression:     81yo female w/o sig PMHx admitted with Methacillin Sensitive Staph aureus pneumonia and Methacillin Resistant Staph aureus bacteremia.     She is a nursing home resident and therefore at increased risk for Methacillin Resistant Staph aureus.  Her CXR shows definite pneumonia.  The blood culture is growing a different organism.    Will ask the lab to confirm the sensitivities of both Staph aureus isolates.    Continue vancomycin as this will cover both.    TTE showed no vegetations.  If her repeat BCx remain negative, then would plan on treating for two weeks with IV therapy.  Would not pursue TEE.  Electronic Signatures: Blocker MPH, Rosalyn GessMichael E (MD)  (Signed on 08-Mar-14 13:07)  Authored  Last Updated: 08-Mar-14 13:07 by Blocker MPH, Rosalyn GessMichael E (MD)

## 2015-04-20 NOTE — Consult Note (Signed)
Pt seen and examined. Please see Wilhelmenia BlaseKaryn Earle's notes. Pt with dementia. Family not available now. Pt denies all GI sxs. Hx of regurgitation at SNF but no mention of dysphagia. Modified barium swallow results noted. Delayed emptying of barium. Follow speech therapy's recommendation. In light of pneumonia, no immediate plan for EGD. Will consider later perhaps as outpt if oral intake remains poor. Thanks.  Electronic Signatures: Lutricia Feilh, Paul (MD)  (Signed on 05-Mar-14 16:14)  Authored  Last Updated: 05-Mar-14 16:14 by Lutricia Feilh, Paul (MD)

## 2015-04-21 NOTE — Discharge Summary (Signed)
PATIENT NAME:  Natalie Garcia, Natalie Garcia MR#:  [id number] DATE OF BIRTH:  May 01, 1928  DATE OF ADMISSION:  01/11/2014  DATE OF DISCHARGE:  01/14/2014  DISCHARGE DIAGNOSES: 1.  Aspiration pneumonia.  2.  Acute respiratory failure requiring oxygen, now improved.  3.  Hypertension.  4.  Hyperlipidemia.  5.  Dementia.  6.  Microcytic anemia.  7.  ESBL E. coli in urine, does not look  really infected.    CODE STATUS: DO NOT RESUSCITATE.   MEDICATIONS ON DISCHARGE: 1.  Clopidogrel 75 mg oral tablet once a day.  2.  Fish oil 1000 mg oral capsule once a day for cholesterol.  3.  Pantoprazole 40 mg oral delayed-release capsule once a day.  4.  Buspirone 15 mg oral tablet 3 times a day.  5.  Methocarbamol 750 mg oral tablet once a day.  6.  Albuterol 2.5 mg inhalation every 4 hours as needed for shortness of breath.  7.  Atenolol 100 mg oral tablet once a day.  8.  Lasix 20 mg once a day.  9.  Paxil 30 mg oral tablet once a day.  10.  Calcium and vitamin D once a day.  11.  Remeron 15 mg oral tablet once a day.  12.  Depakote 125 mg oral delayed-release capsule 2 times a day.  13.  Zocor 10 mg oral tablet once a day.  14.  Loperamide 0.5 mg oral tablet every 8 hours for anxiety.  15.  Tylenol 325 mg oral tablet every 4 hours as needed for pain or fever.  16.  Lorazepam 0.5 mg oral tablet every 6 hours.  17.  Calcium and vitamin D tablet oral daily.  18.  Ferrous sulfate 5 mL 2 times a day.  19.  Amoxicillin and clavulanate 1 tablet 2 times a day for 4 days.  20.  Bactrim Double Strength 800 Plus 160 oral tablet 2 times a day for 7 days.  INSTRUCTIONS: Advised to have low sodium mechanical soft pureed diet, and have full assistance with feeding.   HISTORY OF PRESENT ILLNESS: An 81 year old female who presented from skilled nursing facility due to hypoxia, also possible aspiration episode. She was in her usual state of health and she was eating lunch, had an episode of vomiting, and also vomited some  food and some greenish-looking liquid in the morning. Family noted that patient was somewhat drowsy and short of breath. They checked oxygen, she was hypoxic. They suspected patient had aspiration pneumonia, so sent to Emergency Room for further evaluation. In the ER, she was noted to have leukocytosis of 33,000, and chest x-ray consistent with bilateral infiltrate, so she was admitted with diagnosis of possible aspiration pneumonia.   HOSPITAL COURSE AND STAY:  1.  Gradually, initially she was started on broad-spectrum antibiotic, but had very good response and was improving nicely. Came to room air, so we discharged her on Augmentin. Her white cell count also came down with treatment, and mental level improved nicely.   2.  ESBL in urine. Urine culture was sent on admission, and later on it was reported having 15,000 colony forming units  with ESBL. It was not significant of infection, but because of presence of ESBL we decided to treat, as sensitive to Bactrim, so we are sending her with Bactrim for 7 days.  Other medical issues:   3.  Hypotension. Hemodynamically stable. We continued atenolol.   4.  Hyperlipidemia. We continued simvastatin.   5.  Anxiety and depression. We continued  Paxil, Depakote, and Ativan.    6.  Microcytic anemia. We gave iron supplementation on discharge.   IMPORTANT LAB RESULTS IN THE HOSPITAL: Blood culture on admission, no growth. White cell count 31,000, hemoglobin 12.9, platelet count 542. Glucose was 140, creatinine 0.58, sodium 133, and potassium 4.7. Troponin less than 0.02. Chest x-ray portable showed new left lower lobe airspace disease. On ABG, pH is 7.42, pCO2 is 45, and pO2 is 56. Urinalysis: 749 RBCs and 13 WBCs. Urine culture showed 15,000 colony-forming units E. coli, which are ESBL, and sensitive to Bactrim. White cell count came down to 17,000 on January 16 and 12,000 on January 17.   Total time spent on this discharge:  40 minutes.     ____________________________ Hope Pigeon Elisabeth Pigeon, MD vgv:mr D: 01/18/2014 15:04:48 ET T: 01/18/2014 20:27:38 ET JOB#: [id number]  cc: Hope Pigeon. Elisabeth Pigeon, MD, <Dictator> Burley Saver, MD  Altamese Dilling MD ELECTRONICALLY SIGNED 01/20/2014 18:52

## 2015-04-21 NOTE — H&P (Signed)
PATIENT NAME:  Natalie Garcia, Natalie Garcia MR#:  [id number] DATE OF BIRTH:  12-18-1928  DATE OF ADMISSION:  01/11/2014  PRIMARY CARE PHYSICIAN: Dr. Clayborn Bigness.   CHIEF COMPLAINT: Shortness of breath and hypoxia and aspiration pneumonia.   HISTORY OF PRESENT ILLNESS: This is an 81 year old female who presents from a skilled nursing facility due to hypoxia and also possible aspiration episode. The patient was in her usual state of health when yesterday while she was eating lunch, she had an episode of vomiting. The patient also vomited up some food and some greenish looking liquid as per the patient's family. This morning, the patient was noted to be hypoxic and more short of breath. She was suspected to have an aspiration pneumonia and therefore sent over to the ER for further evaluation. As per the family, the patient did have pneumonia abut 3 weeks or so ago and was treated with Levaquin at the skilled nursing facility. Her symptoms had improved and they were going to do a repeat chest x-ray come this week for follow-up for her pneumonia when she had the hypoxic and aspiration episode yesterday. She presented to the ER and was noted to have leukocytosis of 33,000. Her chest x-ray was also consistent with bilateral infiltrates and suspected aspiration episode. Hospitalist services were contacted for further treatment and evaluation.   REVIEW OF SYSTEMS: CONSTITUTIONAL: No documented fever. No weight gain, no weight loss.  EYES: No blurred or double vision.  ENT: No tinnitus. No postnasal drip. No redness of the oropharynx.  RESPIRATORY: Positive cough. No wheeze, no hemoptysis. Positive dyspnea.  CARDIOVASCULAR: No chest pain, no orthopnea, no palpitations, no syncope.  GASTROINTESTINAL: No nausea, vomiting, diarrhea. No abdominal pain. No melena or hematochezia.  GENITOURINARY: No dysuria, no hematuria.  ENDOCRINE: No polyuria or nocturia. No heat or cold intolerance.   HEMATOLOGIC:  No anemia. No bruising.  No bleeding.  INTEGUMENTARY: No rashes. No lesions.  MUSCULOSKELETAL: No arthritis, swelling or gout.  NEUROLOGIC: No numbness or tingling. No ataxia. No seizure-type activity.  PSYCHIATRIC: Positive anxiety. Positive depression. No ADD.   PAST MEDICAL HISTORY: Consistent with history of dementia, anxiety/depression, history of MRSA pneumonia, history of aspiration pneumonia, hypertension, GERD, hyperlipidemia, history of previous CVA.   ALLERGIES: No known drug allergies.   SOCIAL HISTORY: No smoking. No alcohol abuse. No illicit drug abuse. Resides at a skilled nursing facility.   FAMILY HISTORY: Both mother and father are deceased. Mother died from a stroke. Father died from a brain tumor.   CURRENT MEDICATIONS: As follows: Albuterol nebulizer q. 4 hours as needed, atenolol 100 mg daily, buspirone 15 mg, 1 tab t.i.d., Plavix 75 mg daily, Depakote 125 mg b.i.d., fish oil 1000 mg daily, Lasix 20 mg daily, loperamide 2 mg as needed for diarrhea, lorazepam 0.5 mg q. 6 hours, and lorazepam 0.5 mg q. 8 hours as needed. Methocarbamol 750 mg daily, MiraLax daily as needed, oyster shell with calcium 1 tab daily, Protonix 40 mg daily, Paxil 30 mg daily, Remeron 15 mg at bedtime, Tylenol 325 mg 2 tabs q. 4 hours as needed, Zocor 10 mg at bedtime.   PHYSICAL EXAMINATION: VITAL SIGNS: Temperature 96.9, pulse 63, respirations 24, blood pressure 145/67, sats 93% on 2 liters nasal cannula.  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  HEAD, EYES, EARS, NOSE, THROAT: Atraumatic, normocephalic. Extraocular muscles are intact. Pupils are equal and reactive to light. Sclerae anicteric. No conjunctival injection. No pharyngeal erythema.  NECK: Supple. There is no  jugular venous distention. No bruits, no lymphadenopathy, no thyromegaly.  HEART: Regular rate and rhythm. No murmurs, no rubs, no clicks.  LUNGS: She has some coarse crackles at the bases bilaterally. Negative use of  accessory muscles. No dullness to percussion.  ABDOMEN: Soft, flat, nontender, nondistended. Has good bowel sounds. No hepatosplenomegaly appreciated.  EXTREMITIES: No evidence of any cyanosis, clubbing, or peripheral edema. Has +2 pedal and radial pulses bilaterally.  NEUROLOGIC: The patient is alert, awake, and oriented x 3. She is globally weak secondary to a previous stroke. No other focal motor or sensory bilaterally.  SKIN: Moist and warm with no rashes appreciated.  LYMPHATIC: There is no cervical or axillary adenopathy.   LABORATORY EXAM: Showed a serum glucose of 140, BUN 13, creatinine 0.5, sodium 133, potassium 100, chloride 100, bicarbonate 29.   LFTs are within normal limits. Troponin less than 0.02. White cell count 31.2, hemoglobin 12.9, hematocrit 40.7, platelet count of 542.   The patient did have a chest x-ray done which showed new left lower lobe air space disease could affect pneumonia or possible aspiration. Perhaps minimally increased opacity in the right base could reflect atelectasis and/or additional focus of infiltrate or aspiration. Chronic bronchitic changes and interstitial prominence.   ASSESSMENT AND PLAN: This is an 81 year old female with a history of dementia, anxiety, history of methicillin-resistant Staphylococcus aureus pneumonia, history of aspiration pneumonia, hypertension, gastroesophageal reflux disease, hyperlipidemia, history of previous cerebrovascular accident, who presents to the hospital due to nausea, vomiting and a possible aspiration episode yesterday noted to be hypoxic.   PROBLEM: 1.  Aspiration pneumonia. This is likely the suspected diagnosis given the patient's nausea and vomiting yesterday and then shortly thereafter the patient noted to be hypoxic. The patient does have a history of previous MRSA pneumonia. I will empirically treated her with vancomycin and Zosyn. Follow sputum and blood cultures and follow her clinically. She does not appear  to be significantly hypoxic presently. I will get a speech evaluation to further assess her swallowing. She is currently on a mechanical soft diet.  2.  Leukocytosis: Suspect this is secondary to pneumonia. I will follow white cell count after antibiotic therapy.  3.  Hypertension, presently hemodynamically stable. Continue atenolol.  4.  Gastroesophageal reflux disease. Continue Protonix.  5.  Hyperlipidemia. Continue simvastatin.  6.  Anxiety/depression. Continue with the patient's BuSpar, Paxil, Depakote and Ativan.  7.  History of previous CVA. Continue Plavix and a statin.   The patient is a DO NOT INTUBATE/DO NOT RESUSCITATE.   Time spent on admission: 50 minutes    ____________________________ Rolly PancakeVivek J. Cherlynn KaiserSainani, MD vjs:dp D: 01/11/2014 15:58:49 ET T: 01/11/2014 16:38:29 ET JOB#: [id number]  cc: Rolly PancakeVivek J. Cherlynn KaiserSainani, MD, <Dictator> Houston SirenVIVEK J SAINANI MD ELECTRONICALLY SIGNED 01/25/2014 11:28

## 2015-12-16 ENCOUNTER — Other Ambulatory Visit
Admission: RE | Admit: 2015-12-16 | Discharge: 2015-12-16 | Disposition: A | Discharge status: Home / Self Care | Payer: Medicare Other | Source: Ambulatory Visit | Attending: Family Medicine | Admitting: Family Medicine

## 2015-12-16 DIAGNOSIS — J069 Acute upper respiratory infection, unspecified: Secondary | ICD-10-CM | POA: Insufficient documentation

## 2015-12-16 DIAGNOSIS — J690 Pneumonitis due to inhalation of food and vomit: Secondary | ICD-10-CM | POA: Insufficient documentation

## 2015-12-16 LAB — CBC WITH DIFFERENTIAL/PLATELET
BASOS ABS: 0 10*3/uL (ref 0–0.1)
Basophils Relative: 0 %
EOS ABS: 0 10*3/uL (ref 0–0.7)
Eosinophils Relative: 0 %
HCT: 44.6 % (ref 35.0–47.0)
HEMOGLOBIN: 14.2 g/dL (ref 12.0–16.0)
LYMPHS PCT: 2 %
Lymphs Abs: 0.8 10*3/uL — ABNORMAL LOW (ref 1.0–3.6)
MCH: 26.5 pg (ref 26.0–34.0)
MCHC: 31.8 g/dL — AB (ref 32.0–36.0)
MCV: 83.3 fL (ref 80.0–100.0)
MONOS PCT: 2 %
Monocytes Absolute: 0.8 10*3/uL (ref 0.2–0.9)
NEUTROS ABS: 39 10*3/uL — AB (ref 1.4–6.5)
NEUTROS PCT: 96 %
Platelets: 184 10*3/uL (ref 150–440)
RBC: 5.35 MIL/uL — AB (ref 3.80–5.20)
RDW: 17 % — AB (ref 11.5–14.5)
WBC: 40.6 10*3/uL — AB (ref 3.6–11.0)

## 2015-12-16 LAB — BASIC METABOLIC PANEL
ANION GAP: 14 (ref 5–15)
BUN: 42 mg/dL — ABNORMAL HIGH (ref 6–20)
CALCIUM: 9.3 mg/dL (ref 8.9–10.3)
CO2: 20 mmol/L — ABNORMAL LOW (ref 22–32)
Chloride: 105 mmol/L (ref 101–111)
Creatinine, Ser: 1.17 mg/dL — ABNORMAL HIGH (ref 0.44–1.00)
GFR, EST AFRICAN AMERICAN: 48 mL/min — AB (ref >60)
GFR, EST NON AFRICAN AMERICAN: 41 mL/min — AB (ref >60)
Glucose, Bld: 146 mg/dL — ABNORMAL HIGH (ref 65–99)
POTASSIUM: 3.4 mmol/L — AB (ref 3.5–5.1)
SODIUM: 139 mmol/L (ref 135–145)

## 2015-12-30 DEATH — deceased
